# Patient Record
Sex: Male | Born: 1965 | Race: White | Hispanic: No | Marital: Married | Smoking: Current every day smoker
Health system: Southern US, Community
[De-identification: ages and names within clinical notes are randomized; demographics above are authoritative.]

## PROBLEM LIST (undated history)

## (undated) DIAGNOSIS — R51 Headache: Secondary | ICD-10-CM

## (undated) DIAGNOSIS — I2510 Atherosclerotic heart disease of native coronary artery without angina pectoris: Secondary | ICD-10-CM

## (undated) DIAGNOSIS — E785 Hyperlipidemia, unspecified: Secondary | ICD-10-CM

## (undated) DIAGNOSIS — F17200 Nicotine dependence, unspecified, uncomplicated: Secondary | ICD-10-CM

## (undated) DIAGNOSIS — K5792 Diverticulitis of intestine, part unspecified, without perforation or abscess without bleeding: Secondary | ICD-10-CM

## (undated) DIAGNOSIS — I214 Non-ST elevation (NSTEMI) myocardial infarction: Secondary | ICD-10-CM

## (undated) DIAGNOSIS — F429 Obsessive-compulsive disorder, unspecified: Secondary | ICD-10-CM

## (undated) DIAGNOSIS — N529 Male erectile dysfunction, unspecified: Secondary | ICD-10-CM

## (undated) DIAGNOSIS — J449 Chronic obstructive pulmonary disease, unspecified: Secondary | ICD-10-CM

## (undated) DIAGNOSIS — I10 Essential (primary) hypertension: Secondary | ICD-10-CM

## (undated) DIAGNOSIS — T1490XA Injury, unspecified, initial encounter: Secondary | ICD-10-CM

## (undated) DIAGNOSIS — I739 Peripheral vascular disease, unspecified: Secondary | ICD-10-CM

## (undated) DIAGNOSIS — K219 Gastro-esophageal reflux disease without esophagitis: Secondary | ICD-10-CM

## (undated) DIAGNOSIS — F419 Anxiety disorder, unspecified: Secondary | ICD-10-CM

## (undated) DIAGNOSIS — K635 Polyp of colon: Secondary | ICD-10-CM

## (undated) DIAGNOSIS — R519 Headache, unspecified: Secondary | ICD-10-CM

## (undated) DIAGNOSIS — Z9889 Other specified postprocedural states: Secondary | ICD-10-CM

## (undated) DIAGNOSIS — R42 Dizziness and giddiness: Secondary | ICD-10-CM

## (undated) DIAGNOSIS — IMO0001 Reserved for inherently not codable concepts without codable children: Secondary | ICD-10-CM

## (undated) DIAGNOSIS — M1990 Unspecified osteoarthritis, unspecified site: Secondary | ICD-10-CM

## (undated) DIAGNOSIS — K449 Diaphragmatic hernia without obstruction or gangrene: Secondary | ICD-10-CM

## (undated) HISTORY — DX: Other specified postprocedural states: Z98.890

## (undated) HISTORY — DX: Diverticulitis of intestine, part unspecified, without perforation or abscess without bleeding: K57.92

## (undated) HISTORY — DX: Polyp of colon: K63.5

## (undated) HISTORY — DX: Obsessive-compulsive disorder, unspecified: F42.9

## (undated) HISTORY — DX: Diaphragmatic hernia without obstruction or gangrene: K44.9

## (undated) HISTORY — DX: Unspecified osteoarthritis, unspecified site: M19.90

## (undated) HISTORY — PX: COLON SURGERY: SHX602

## (undated) HISTORY — DX: Dizziness and giddiness: R42

## (undated) HISTORY — DX: Peripheral vascular disease, unspecified: I73.9

## (undated) HISTORY — DX: Headache, unspecified: R51.9

## (undated) HISTORY — PX: CARDIAC CATHETERIZATION: SHX172

## (undated) HISTORY — DX: Gastro-esophageal reflux disease without esophagitis: K21.9

## (undated) HISTORY — DX: Essential (primary) hypertension: I10

## (undated) HISTORY — PX: HERNIA REPAIR: SHX51

## (undated) HISTORY — DX: Hyperlipidemia, unspecified: E78.5

## (undated) HISTORY — PX: UPPER ENDOSCOPY W/ ESOPHAGEAL MANOMETRY: SHX2604

## (undated) HISTORY — DX: Male erectile dysfunction, unspecified: N52.9

## (undated) HISTORY — DX: Headache: R51

---

## 2000-02-13 ENCOUNTER — Emergency Department (HOSPITAL_COMMUNITY): Admission: EM | Admit: 2000-02-13 | Discharge: 2000-02-13 | Payer: Self-pay | Admitting: Emergency Medicine

## 2002-08-13 ENCOUNTER — Encounter (INDEPENDENT_AMBULATORY_CARE_PROVIDER_SITE_OTHER): Payer: Self-pay | Admitting: Specialist

## 2002-08-13 ENCOUNTER — Ambulatory Visit (HOSPITAL_COMMUNITY): Admission: RE | Admit: 2002-08-13 | Discharge: 2002-08-13 | Payer: Self-pay | Admitting: *Deleted

## 2005-03-11 ENCOUNTER — Emergency Department (HOSPITAL_COMMUNITY): Admission: EM | Admit: 2005-03-11 | Discharge: 2005-03-11 | Payer: Self-pay | Admitting: Emergency Medicine

## 2007-03-02 ENCOUNTER — Emergency Department (HOSPITAL_COMMUNITY): Admission: EM | Admit: 2007-03-02 | Discharge: 2007-03-02 | Payer: Self-pay | Admitting: Emergency Medicine

## 2007-07-24 ENCOUNTER — Emergency Department (HOSPITAL_COMMUNITY): Admission: EM | Admit: 2007-07-24 | Discharge: 2007-07-25 | Payer: Self-pay | Admitting: Emergency Medicine

## 2008-02-15 ENCOUNTER — Emergency Department (HOSPITAL_COMMUNITY): Admission: EM | Admit: 2008-02-15 | Discharge: 2008-02-15 | Payer: Self-pay | Admitting: Emergency Medicine

## 2008-06-08 ENCOUNTER — Inpatient Hospital Stay (HOSPITAL_COMMUNITY): Admission: EM | Admit: 2008-06-08 | Discharge: 2008-06-10 | Payer: Self-pay | Admitting: Emergency Medicine

## 2008-06-08 ENCOUNTER — Ambulatory Visit: Payer: Self-pay | Admitting: Cardiovascular Disease

## 2008-06-08 ENCOUNTER — Encounter (INDEPENDENT_AMBULATORY_CARE_PROVIDER_SITE_OTHER): Payer: Self-pay | Admitting: *Deleted

## 2008-06-15 ENCOUNTER — Ambulatory Visit: Payer: Self-pay | Admitting: Vascular Surgery

## 2008-12-28 ENCOUNTER — Encounter: Admission: RE | Admit: 2008-12-28 | Discharge: 2008-12-28 | Payer: Self-pay | Admitting: Gastroenterology

## 2009-02-28 ENCOUNTER — Encounter (INDEPENDENT_AMBULATORY_CARE_PROVIDER_SITE_OTHER): Payer: Self-pay | Admitting: General Surgery

## 2009-02-28 ENCOUNTER — Inpatient Hospital Stay (HOSPITAL_COMMUNITY): Admission: RE | Admit: 2009-02-28 | Discharge: 2009-03-05 | Payer: Self-pay | Admitting: General Surgery

## 2009-06-14 ENCOUNTER — Emergency Department (HOSPITAL_COMMUNITY): Admission: EM | Admit: 2009-06-14 | Discharge: 2009-06-14 | Payer: Self-pay | Admitting: Family Medicine

## 2010-02-14 ENCOUNTER — Encounter: Payer: Self-pay | Admitting: Emergency Medicine

## 2010-02-14 ENCOUNTER — Ambulatory Visit: Payer: Self-pay | Admitting: Interventional Radiology

## 2010-02-14 ENCOUNTER — Inpatient Hospital Stay (HOSPITAL_COMMUNITY): Admission: EM | Admit: 2010-02-14 | Discharge: 2010-02-16 | Payer: Self-pay | Admitting: Internal Medicine

## 2010-02-28 ENCOUNTER — Ambulatory Visit: Payer: Self-pay | Admitting: Oncology

## 2010-03-14 LAB — COMPREHENSIVE METABOLIC PANEL
ALT: 16 U/L (ref 0–53)
AST: 15 U/L (ref 0–37)
Alkaline Phosphatase: 65 U/L (ref 39–117)
BUN: 14 mg/dL (ref 6–23)
Total Bilirubin: 0.4 mg/dL (ref 0.3–1.2)
Total Protein: 7.3 g/dL (ref 6.0–8.3)

## 2010-03-14 LAB — MORPHOLOGY
PLT EST: ADEQUATE
White Cell Comments: NONE SEEN

## 2010-03-14 LAB — CBC WITH DIFFERENTIAL/PLATELET
BASO%: 0.6 % (ref 0.0–2.0)
EOS%: 3.1 % (ref 0.0–7.0)
Eosinophils Absolute: 0.4 10*3/uL (ref 0.0–0.5)
HCT: 41.1 % (ref 38.4–49.9)
LYMPH%: 27 % (ref 14.0–49.0)
MCH: 33.7 pg — ABNORMAL HIGH (ref 27.2–33.4)
MCHC: 34.7 g/dL (ref 32.0–36.0)
MCV: 97.2 fL (ref 79.3–98.0)
NEUT#: 7.7 10*3/uL — ABNORMAL HIGH (ref 1.5–6.5)
NEUT%: 61.4 % (ref 39.0–75.0)
Platelets: 352 10*3/uL (ref 140–400)
lymph#: 3.4 10*3/uL — ABNORMAL HIGH (ref 0.9–3.3)

## 2010-03-14 LAB — CHCC SMEAR

## 2010-10-04 ENCOUNTER — Other Ambulatory Visit: Payer: Self-pay | Admitting: Family Medicine

## 2010-10-04 DIAGNOSIS — R1011 Right upper quadrant pain: Secondary | ICD-10-CM

## 2010-10-05 ENCOUNTER — Ambulatory Visit
Admission: RE | Admit: 2010-10-05 | Discharge: 2010-10-05 | Disposition: A | Discharge status: Home / Self Care | Payer: BC Managed Care – PPO | Source: Ambulatory Visit | Attending: Family Medicine | Admitting: Family Medicine

## 2010-10-05 DIAGNOSIS — R1011 Right upper quadrant pain: Secondary | ICD-10-CM

## 2010-10-20 LAB — CK TOTAL AND CKMB (NOT AT ARMC)
Relative Index: 1 (ref 0.0–2.5)
Total CK: 310 U/L — ABNORMAL HIGH (ref 7–232)

## 2010-10-20 LAB — TSH: TSH: 8.068 u[IU]/mL — ABNORMAL HIGH (ref 0.350–4.500)

## 2010-10-20 LAB — BASIC METABOLIC PANEL
CO2: 27 mEq/L (ref 19–32)
Chloride: 108 mEq/L (ref 96–112)
GFR calc Af Amer: >60 mL/min (ref >60)

## 2010-10-21 LAB — DIFFERENTIAL
Basophils Absolute: 0 10*3/uL (ref 0.0–0.1)
Basophils Absolute: 0.2 10*3/uL — ABNORMAL HIGH (ref 0.0–0.1)
Basophils Relative: 0 % (ref 0–1)
Basophils Relative: 1 % (ref 0–1)
Eosinophils Absolute: 0.4 10*3/uL (ref 0.0–0.7)
Eosinophils Absolute: 0.4 10*3/uL (ref 0.0–0.7)
Eosinophils Relative: 4 % (ref 0–5)
Eosinophils Relative: 3 % (ref 0–5)
Lymphs Abs: 2.8 10*3/uL (ref 0.7–4.0)
Monocytes Relative: 9 % (ref 3–12)
Neutrophils Relative %: 57 % (ref 43–77)
Neutrophils Relative %: 64 % (ref 43–77)

## 2010-10-21 LAB — COMPREHENSIVE METABOLIC PANEL
ALT: 15 U/L (ref 0–53)
AST: 33 U/L (ref 0–37)
Alkaline Phosphatase: 85 U/L (ref 39–117)
CO2: 27 mEq/L (ref 19–32)
Chloride: 100 mEq/L (ref 96–112)
GFR calc Af Amer: 30 mL/min — ABNORMAL LOW (ref >60)
GFR calc non Af Amer: 25 mL/min — ABNORMAL LOW (ref >60)
Glucose, Bld: 93 mg/dL (ref 70–99)
Sodium: 140 mEq/L (ref 135–145)
Total Bilirubin: 0.7 mg/dL (ref 0.3–1.2)

## 2010-10-21 LAB — URINALYSIS, ROUTINE W REFLEX MICROSCOPIC
Bilirubin Urine: NEGATIVE
Glucose, UA: NEGATIVE mg/dL
Hgb urine dipstick: NEGATIVE
Ketones, ur: NEGATIVE mg/dL
pH: 5.5 (ref 5.0–8.0)

## 2010-10-21 LAB — CREATININE, URINE, RANDOM: Creatinine, Urine: 160.3 mg/dL

## 2010-10-21 LAB — CBC
HCT: 38.5 % — ABNORMAL LOW (ref 39.0–52.0)
HCT: 44.8 % (ref 39.0–52.0)
Hemoglobin: 15.4 g/dL (ref 13.0–17.0)
MCH: 33.3 pg (ref 26.0–34.0)
MCH: 33.4 pg (ref 26.0–34.0)
MCV: 96.7 fL (ref 78.0–100.0)
RDW: 13.9 % (ref 11.5–15.5)
WBC: 9.3 10*3/uL (ref 4.0–10.5)
WBC: 13.5 10*3/uL — ABNORMAL HIGH (ref 4.0–10.5)

## 2010-10-21 LAB — POCT CARDIAC MARKERS

## 2010-10-21 LAB — LIPID PANEL
Cholesterol: 180 mg/dL (ref 0–200)
HDL: 24 mg/dL — ABNORMAL LOW (ref >39)
Triglycerides: 214 mg/dL — ABNORMAL HIGH (ref <150)

## 2010-10-21 LAB — BASIC METABOLIC PANEL
BUN: 24 mg/dL — ABNORMAL HIGH (ref 6–23)
Creatinine, Ser: 1.72 mg/dL — ABNORMAL HIGH (ref 0.4–1.5)
GFR calc non Af Amer: 44 mL/min — ABNORMAL LOW (ref >60)
Glucose, Bld: 110 mg/dL — ABNORMAL HIGH (ref 70–99)

## 2010-10-21 LAB — CARDIAC PANEL(CRET KIN+CKTOT+MB+TROPI)
CK, MB: 6 ng/mL — ABNORMAL HIGH (ref 0.3–4.0)
Relative Index: 1.1 (ref 0.0–2.5)
Relative Index: 1.1 (ref 0.0–2.5)
Total CK: 454 U/L — ABNORMAL HIGH (ref 7–232)
Troponin I: 0.02 ng/mL (ref 0.00–0.06)

## 2010-10-21 LAB — SODIUM, URINE, RANDOM: Sodium, Ur: 53 mEq/L

## 2010-11-11 LAB — DIFFERENTIAL
Basophils Absolute: 0 10*3/uL (ref 0.0–0.1)
Basophils Relative: 0 % (ref 0–1)
Monocytes Relative: 8 % (ref 3–12)
Neutro Abs: 9.1 10*3/uL — ABNORMAL HIGH (ref 1.7–7.7)
Neutrophils Relative %: 69 % (ref 43–77)

## 2010-11-11 LAB — CBC
HCT: 42.8 % (ref 39.0–52.0)
Hemoglobin: 14.8 g/dL (ref 13.0–17.0)
MCV: 96.1 fL (ref 78.0–100.0)
RDW: 14.6 % (ref 11.5–15.5)

## 2010-11-11 LAB — COMPREHENSIVE METABOLIC PANEL
Alkaline Phosphatase: 75 U/L (ref 39–117)
BUN: 10 mg/dL (ref 6–23)
Glucose, Bld: 120 mg/dL — ABNORMAL HIGH (ref 70–99)
Potassium: 4.3 mEq/L (ref 3.5–5.1)
Total Bilirubin: 0.7 mg/dL (ref 0.3–1.2)
Total Protein: 7.1 g/dL (ref 6.0–8.3)

## 2010-12-18 NOTE — Discharge Summary (Signed)
NAME:  Brett, Davis NO.:  [id number]   MEDICAL RECORD NO.:  [id number]          PATIENT TYPE:  INP   LOCATION:  6523                         FACILITY:  [facility]   PHYSICIAN:  Elmore Guise., M.D.DATE OF BIRTH:  05-18-1966   DATE OF ADMISSION:  06/08/2008  DATE OF DISCHARGE:  06/10/2008                               DISCHARGE SUMMARY   DISCHARGE DIAGNOSES:  1. Acute coronary syndrome with non-ST elevation myocardial      infarction.  2. Dyslipidemia.  3. Obstructive single-vessel coronary artery disease.  4. Status post non-drug-eluting stent in proximal right coronary      artery.  5. History of tobacco dependence.   HISTORY OF PRESENT ILLNESS:  Brett Davis is a very pleasant 45 year old  white male with past medical history of tobacco dependence and  borderline hypertension who presented with chest pain.  He was found to  have acute coronary syndrome and non-ST-elevation myocardial infarction.  He was admitted for treatment and evaluation.   HOSPITAL COURSE:  The patient's hospital course was uncomplicated.  He  had a peak troponin of 8.  He was taken to the cath lab, which showed a  95-99% proximal right coronary artery stenosis.  He underwent successful  stenting.  He was noted to have hematoma in and around his sheath site  while he was in the cath lab.  His postprocedure course was  uncomplicated.  He has had no further chest pain.  His hematoma has  actually improved in size.  He has got a good pulse in his right leg  with no bruit and good distal pulses too.   He will be discharged home to continue the following medications:  1. Simvastatin 40 mg daily.  2. Aspirin 325 mg daily.  3. Toprol 25 mg daily.  4. Plavix 75 mg daily.  5. Nitroglycerin 0.4 mg 1 sublingual every 5 minutes as needed for      chest pain.   He reports that he is having financial difficulties.  I did ask him to  come by the office to get samples of both Plavix as well as  a  cholesterol medication, Crestor 20 mg taking half tablet once daily.  He  will have post cath restrictions.  I discussed no heavy lifting or  strenuous activities for the next 4 days because of his hematoma.  He is  to just go  home and rest today.  He is to avoid any heavy lifting for the next 1-2  weeks.  He should have complete tobacco cessation.  I would like to see  him in the office in 1 week.  Unless he has any further problems, I plan  to see him in the office at that time.      Elmore Guise., M.D.  Electronically Signed     TWK/MEDQ  D:  06/10/2008  T:  06/10/2008  Job:  [id number]   cc:   Jethro Bastos, M.D.

## 2010-12-18 NOTE — Procedures (Signed)
VASCULAR LAB EXAM   INDICATION:  The patient had an MI last week and a stent via the right  common femoral artery.  The patient has a large hematoma over the right  groin.   HISTORY:  Diabetes:  No.  Cardiac:  Recent MI with stenting.  Hypertension:  Yes.   EXAM:  Duplex exam of right common femoral artery, common femoral vein  and surrounding soft tissue.   IMPRESSION:  No evidence of right groin pseudoaneurysm, AV fistula or  right common femoral artery dissection.   ___________________________________________  Larina Earthly, M.D.   MC/MEDQ  D:  06/15/2008  T:  06/15/2008  Job:  [id number]

## 2010-12-18 NOTE — H&P (Signed)
NAME:  Brett Davis, Brett Davis NO.:  [id number]   MEDICAL RECORD NO.:  [id number]          PATIENT TYPE:  INP   LOCATION:  2926                         FACILITY:  [facility]   PHYSICIAN:  Rosine Abe, M.D.  DATE OF BIRTH:  1965-10-25   DATE OF ADMISSION:  06/08/2008  DATE OF DISCHARGE:                              HISTORY & PHYSICAL   CHIEF COMPLAINT:  Chest pain.   HISTORY OF PRESENT ILLNESS:  Brett Davis is a 45 year old gentleman  with past medical history significant now only for hypertension and  tobacco abuse, came in with chest pain for the last 2 days.  It feels  like pressure and tightness when he had 3 episodes yesterday and few  episodes today.  Each time it lasted for about 10-15 minutes, radiates  to jaw and right arm.  They are both exertional and nonexertional in  nature.  He also has some dyspnea and sweating and nausea but no  vomiting.  He also had some acid reflux and lower abdominal pain.  No  fever, chills.   ALLERGIES:  No known drug allergies.   PAST MEDICAL HISTORY:  Significant now only for hypertension.   SOCIAL HISTORY:  He lives in [place] with his fiancee and he is an  Engineer, maintenance.  He has a smoking history of 1 pack per day for 15  years.  He does not use alcohol or drugs.   FAMILY HISTORY:  No history of early CAD.   REVIEW OF SYSTEMS:  As per HPI, All others negative   HOME MEDICATIONS:  Hydrochlorothiazide 25 mg once totally for last 1  month.   PHYSICAL EXAMINATION:  VITALS:  Temperature 98.1, pulse 90, respirations  28, blood pressure 157/87.  GENERAL:  He is not in distress.  HEENT:  Extraocular muscle movement intact.  PERRLA.  Sclera clear.  NECK:  Supple.  No bruit or JVD.  CARDIOVASCULAR:  First and second heart sound normal.  Regular rales,  murmur or gallops.  LUNGS:  Clear to auscultation bilaterally without crackles or wheeze.  ABDOMEN: Soft and nontender.  No organomegaly.  EXTREMITIES:  No edema.  Pedal  pulses good.  NEURO:  Alert and oriented x3.   Chest x-ray, stable borderline, without any acute cardiopulmonary  process.  A 12-lead EKG, T-wave inversion in lead III and biphasic T  waves in lead VF and flat T-wave in lead V6.  This is only change that  comparative to last one.   LABORATORY DATA:  WBC 15.8 with absolute count of 0.6, hemoglobin 15.3,  platelets 314.  Sodium 139, potassium 3.8, chloride of 103, bicarb 27,  BUN 12, creatinine 1.18, glucose 143, calcium 9.4.  Point of care  cardiac enzymes first set CK-MB 11.1, troponin 0.8, myoglobulin 79.3.  Second set, CK-MB 9.8, troponin 0.91, myoglobulin 163.  First set of  cardiac enzymes, CK is 366, CK-MB is 17, troponin 1.93, and relative  index 4.6.   ASSESSMENT AND PLAN:  Brett Davis is 45 year old gentleman with past  medical history of hypertension, presented with NSTEMI.  The patient has been treated already with aspirin and  nitroglycerin drip  plus heparin drip in the ER.  We will continue with aspirin, heparin,  and nitroglycerin drip.  We will also add Integrilin.  We will admit the  patient to the step-down unit.  Cycle cardiac enzyme and EKG.  Also,  start him on beta-blocker, Lopressor 25 mg p.o. q.6 as well as Zocor 40  mg p.o. daily.  He will probably need a cardiac catheterization all in  the morning.  He will be n.p.o. for this.      Jason Coop, MD  Electronically Signed      ______________________________  Rosine Abe, M.D.    YP/MEDQ  D:  06/08/2008  T:  06/08/2008  Job:  [id number]   cc:   Elmore Guise., M.D.

## 2010-12-18 NOTE — Cardiovascular Report (Signed)
NAME:  Brett Davis, Brett Davis NO.:  [id number]   MEDICAL RECORD NO.:  [id number]          PATIENT TYPE:  INP   LOCATION:  2807                         FACILITY:  [facility]   PHYSICIAN:  Elmore Guise., M.D.DATE OF BIRTH:  1966/01/04   DATE OF PROCEDURE:  06/09/2008  DATE OF DISCHARGE:                            CARDIAC CATHETERIZATION   INDICATIONS FOR PROCEDURE:  Non-ST-elevation myocardial infarction.   DESCRIPTION OF PROCEDURE:  The patient was brought to the cardiac cath  lab after appropriate informed consent.  He was prepped and draped  sterile fashion.  Approximately 10 mL of 1% lidocaine was used for local  anesthesia.  A 5-French sheath was placed in the right femoral artery  without difficulty.  Coronary angiography, LV angiography were then  performed.  There was a small hematoma noted.  A 5-French sheath was  changed out to a 6-French sheath.  Pressure was held with improvement in  his femoral hematoma.  Sheath was left in place to continue all with  elective intervention of his RCA vessel.   FINDINGS:  1. Left Main:  Normal.  2. LAD:  Mild proximal luminal irregularities with mid 40%-50%      stenosis.  3. D1:  Small vessel with mild luminal irregularities.  4. LCX:  Nondominant with mid 40%-50% stenosis after bifurcation of      the first OM vessel.  OM-1 and OM-2 have mild to moderate luminal      irregularities.  5. RCA:  Proximal 99% stenosis with thrombus and small area of      localized dissection.  There is moderate mid to distal luminal      irregularities noted.  6. LV:  EF is 45% with mild inferior hypokinesis noted.  LVEDP was 18      mmHg.   IMPRESSION:  1. Obstructive single-vessel coronary artery disease.  2. Mild to moderate disease in his left anterior descending artery and      circumflex system.  3. Mild left ventricular dysfunction with ejection fraction of 45%      with mild inferior hypokinesis.   PLAN:  1. We will continue  aggressive medical treatment as well as risk      factor modifications.  He will have elective PCI of RCA.      Interventional cardiologist is present and agrees with treatment.      At the end of the case, the patient did have small hematoma noted      at the right femoral area, which was soft and easily compressible.      He was having no significant pain and remained with good distal      pulses.      Elmore Guise., M.D.  Electronically Signed     TWK/MEDQ  D:  06/09/2008  T:  06/09/2008  Job:  [id number]

## 2010-12-18 NOTE — Cardiovascular Report (Signed)
NAME:  Brett Davis, Brett Davis NO.:  [id number]   MEDICAL RECORD NO.:  [id number]         PATIENT TYPE:  CINP   LOCATION:                               FACILITY:  [facility]   PHYSICIAN:  Corky Crafts, MDDATE OF BIRTH:  [birth year]   DATE OF PROCEDURE:  06/09/2008  DATE OF DISCHARGE:                            CARDIAC CATHETERIZATION   PROCEDURE PERFORMED:  Percutaneous coronary intervention of the right  coronary artery aspiration thrombectomy.   OPERATOR:  Corky Crafts, MD   INDICATIONS:  Non-ST-segment elevation MI.   PROCEDURE NOTE:  The diagnostic catheterization was performed by Dr.  Reyes Ivan, and this showed a 90% thrombotic proximal right coronary artery  lesion.  The PCI was the plan of care.  A JR4 guiding catheter was  placed into the ostium of the right coronary artery.  A Prowater wire  was placed down the right coronary artery.  Fetch catheter was used to  try and aspirate thrombus.  Two passes were made but no visible thrombus  was aspirated.  A 2.5 x 12 Apex balloon was then placed across the  lesion and inflated to 8 atmospheres for 23 seconds.  A 3.5 x 18 mm  Vision stent was then placed across the lesion to cover the area that  appeared to have thrombotic material and deployed at 16 atmospheres for  53 seconds.  The stent was then postdilated with a 4.0 x 12 Demarest Voyager  balloon, inflated to 16 atmospheres for 30 seconds.  There was an  excellent angiographic result.  There was no residual stenosis.  There  was TIMI 3 flow.   IMPRESSION:  Successful percutaneous coronary intervention of the right  coronary artery with 3.5 x 18 mm bare-metal stent, postdilated to  greater than 4 mm in diameter.   RECOMMENDATIONS:  Continue aspirin and Plavix for at least 1 month but  likely longer.  He will be watched overnight.  We will continue  Integrilin postprocedure depending on how his groin looks.  If there is  any sign of bleeding, we will stop the  Integrilin.      Corky Crafts, MD  Electronically Signed     JSV/MEDQ  D:  06/09/2008  T:  06/09/2008  Job:  [id number]

## 2010-12-18 NOTE — Discharge Summary (Signed)
NAME:  Brett Davis, Brett Davis            ACCOUNT NO.:  [id number]   MEDICAL RECORD NO.:  [id number]          PATIENT TYPE:  INP   LOCATION:  5156                         FACILITY:  [facility]   PHYSICIAN:  Cherylynn Ridges, M.D.    DATE OF BIRTH:  1966/07/06   DATE OF ADMISSION:  02/28/2009  DATE OF DISCHARGE:  03/05/2009                               DISCHARGE SUMMARY   DISCHARGE DIAGNOSIS:  Sigmoid diverticulitis.   PRINCIPAL PROCEDURE:  Sigmoid colectomy with primary anastomosis.   SURGEON:  Cherylynn Ridges, MD   DISCHARGE MEDICATION:  Vicodin 1-2 tablets every 4 hours p.r.n. for  pain.   He is to follow up to see Dr. Lindie Spruce in 1 week.   RESTRICTIONS:  No lifting greater than 25 ounces for 6 weeks.  He can  drive in 1 week.  He wound care is to shower and pat it dry and cover  with 4 x 4 gauze for some serous drainage.   He is return to see me again in 1 week.   BRIEF SUMMARY AND HOSPITAL COURSE:  The patient is a 45 year old with  recurrent diverticulitis was coming in for an elective sigmoid  colectomy.  The patient underwent a procedure on the February 28, 2009, for  an uneventful sigmoid colectomy with a primary anastomosis.  Postoperatively, did well and started having bowel movements and gas on  postop day #2 and was subsequently advanced to a soft diet by postop day  #4.   It was noted on his wound today that he had some serous drainage.  No  foul smelling or purulent drainage.  His wound looked clean and dry with  no evidence of infection.  Because of this, we are having his wound pain  with Betadine and cover with gauze and he is to maintain a cover until  he comes back to see me for staple removal.   The patient does have a fluid in lower portion of his abdomen because of  his pannus, which makes it possible in this area of staple line will get  inflamed.  We will watch it as an outpatient.  Otherwise, he does not  have any evidence of wound infection.  He is to be discharged  home  today.  Follow up to see me in my office in 1 week.      Cherylynn Ridges, M.D.  Electronically Signed     JOW/MEDQ  D:  03/05/2009  T:  03/05/2009  Job:  [id number]

## 2010-12-18 NOTE — Op Note (Signed)
NAME:  Brett Davis, Brett Davis            ACCOUNT NO.:  [id number]   MEDICAL RECORD NO.:  [id number]          PATIENT TYPE:  INP   LOCATION:  5156                         FACILITY:  [facility]   PHYSICIAN:  Cherylynn Ridges, M.D.    DATE OF BIRTH:  [birth year]   DATE OF PROCEDURE:  02/28/2009  DATE OF DISCHARGE:                               OPERATIVE REPORT   PREOPERATIVE DIAGNOSIS:  Recurrent sigmoid diverticulitis.   POSTOPERATIVE DIAGNOSIS:  Recurrent sigmoid diverticulitis.   PROCEDURE:  Sigmoid colectomy.   SURGEON:  Cherylynn Ridges, MD   ASSISTANT:  Angelia Mould. Derrell Lolling, MD   ANESTHESIA:  General endotracheal.   ESTIMATED BLOOD LOSS:  Less than 100 mL.   COMPLICATIONS:  None.   CONDITION:  Stable.   FINDINGS:  The patient had acute mid sigmoid diverticulitis with  adhesions to the dome of the gallbladder, but no fistulization noted who  now comes in for sigmoid colectomy.   FINDINGS AT THE TIME OF SURGERY:  The patient found a large mid sigmoid  mass.   INDICATIONS FOR OPERATION:  The patient is a 45 year old with recurrent  bouts of diverticulitis who is coming in now for an elective colectomy.   OPERATION:  The patient was taken to the operating room, placed on table  in supine position.  After an adequate general endotracheal anesthetic  was administered, he was prepped and draped in usual sterile manner  exposing the midline of the abdomen.   Midline incision was made from just left the umbilicus down through the  midline down to the pubic crest.  It was taken down to and through the  midline fascia using electrocautery.  We entered to the peritoneal  cavity carefully with Metzenbaum scissors and opened incision fully  using cautery.   The patient was placed in Trendelenburg and we are able to palpate the  chronically inflamed area of sigmoid colon, which was attached distally  towards the bladder to the dome of the bladder.  There was no fistula  noted.  We are able to take  down these adhesions to the dome of bladder  and then subsequently mobilize the distal sigmoid colon and come across  with a GIA-75 stapler, leaving a long the distal segment.  Proximally,  we came about 6-7 cm proximal to the inflammatory mass and came across  that with a GIA-75 stapler.  Then, mesentery was taken with a LigaSure  device.  We removed the segment marked proximally and with a suture.   We freshened the ends of the distal descending colon and the distal  sigmoid colon with cautery and then subsequently did a 2-layered  anastomosis using a Lembert stitches of 3-0 silk and then inner full-  thickness canal stitches with 3-0 Vicryl.  Once the anastomosis was  completed, there was minimal tension.  We irrigated with saline, changed  the surgeon's glove prior to doing so.  There was no spillage of enteric  contents into the wound.  We palpated the entire abdomen, found no other  abnormalities including palpating the spleen, the liver, and stomach.  Old G-tube was removed.  Once we completed the procedure, we closed.   We closed the fascia using a running looped PDS suture, irrigated subcu  and then closed skin using stainless steel staples.  All needle, sponge  counts, and instrument counts were correct.      Cherylynn Ridges, M.D.  Electronically Signed     JOW/MEDQ  D:  02/28/2009  T:  03/01/2009  Job:  [id number]   cc:   Evelena Peat, M.D.  Jordan Hawks Elnoria Howard, MD

## 2011-01-28 ENCOUNTER — Ambulatory Visit (HOSPITAL_COMMUNITY)
Admission: RE | Admit: 2011-01-28 | Discharge: 2011-01-28 | Disposition: A | Discharge status: Home / Self Care | Payer: BC Managed Care – PPO | Source: Ambulatory Visit | Attending: Gastroenterology | Admitting: Gastroenterology

## 2011-01-28 DIAGNOSIS — R1310 Dysphagia, unspecified: Secondary | ICD-10-CM | POA: Insufficient documentation

## 2011-01-28 DIAGNOSIS — R0789 Other chest pain: Secondary | ICD-10-CM | POA: Insufficient documentation

## 2011-01-28 DIAGNOSIS — R12 Heartburn: Secondary | ICD-10-CM | POA: Insufficient documentation

## 2011-01-28 DIAGNOSIS — R05 Cough: Secondary | ICD-10-CM | POA: Insufficient documentation

## 2011-01-28 DIAGNOSIS — R112 Nausea with vomiting, unspecified: Secondary | ICD-10-CM | POA: Insufficient documentation

## 2011-01-28 DIAGNOSIS — R059 Cough, unspecified: Secondary | ICD-10-CM | POA: Insufficient documentation

## 2011-01-28 DIAGNOSIS — K219 Gastro-esophageal reflux disease without esophagitis: Secondary | ICD-10-CM | POA: Insufficient documentation

## 2011-01-28 DIAGNOSIS — Z79899 Other long term (current) drug therapy: Secondary | ICD-10-CM | POA: Insufficient documentation

## 2011-02-26 ENCOUNTER — Encounter (INDEPENDENT_AMBULATORY_CARE_PROVIDER_SITE_OTHER): Payer: BC Managed Care – PPO | Admitting: General Surgery

## 2011-03-01 ENCOUNTER — Encounter (INDEPENDENT_AMBULATORY_CARE_PROVIDER_SITE_OTHER): Payer: Self-pay | Admitting: General Surgery

## 2011-03-04 ENCOUNTER — Ambulatory Visit (INDEPENDENT_AMBULATORY_CARE_PROVIDER_SITE_OTHER): Payer: BC Managed Care – PPO | Admitting: General Surgery

## 2011-03-04 ENCOUNTER — Encounter (INDEPENDENT_AMBULATORY_CARE_PROVIDER_SITE_OTHER): Payer: Self-pay | Admitting: General Surgery

## 2011-03-04 VITALS — BP 142/86 | HR 64 | Temp 97.2°F | Ht 70.0 in | Wt 230.8 lb

## 2011-03-04 DIAGNOSIS — K449 Diaphragmatic hernia without obstruction or gangrene: Secondary | ICD-10-CM

## 2011-03-04 NOTE — Progress Notes (Signed)
Subjective:     Patient ID: Brett Davis, male   DOB: 1966/05/17, 45 y.o.   MRN: [id number]  HPI This is a 45 year old Caucasian male, referred to me by Dr. Jeani Hawking for consideration of hiatal hernia repair and fundoplication. His primary care physician is Dr. Warrick Parisian at [facility]. He is also followed by a cardiologist in [facility].  The patient gives a several year history of reflux symptoms. This is manifested by water brash, substernal burning discomfort, daily vomiting. He has problems intermittently with cough and bronchitis. He states that his primary care physician told him that he had COPD. He is a cigarette smoker.  He has been followed by Dr. Jeani Hawking for about 3 years who has tried a variety of maneuvers but the patient remains symptomatic. A CT scan in 2006 showed a large hiatal hernia. He has had a manometry on January 28, 2011 shows the lower esophageal sphincter pressure of 25 mm of mercury which is normal. 82% of contractions were peristaltic. He's also had a pH study which is on the chart and confirms acid reflux. He had an upper endoscopy by Dr. Elnoria Howard which shows a 5 cm hiatal hernia, esophagitis and duodenitis, refluxate in the esophagus. Biopsy of the stomach showed reactive gastropathy. An ultrasound of his gallbladder showed no stones the gallbladder was contracted.  Past history is significant for coronary artery disease with catheterization and stent by Dr. Ralph Dowdy. in the past. Also he's had a sigmoid colectomy by Dr. Lindie Spruce for diverticulosis in 2010. He continues to smoke.  He states that his reflux symptoms are now interfering with his life and work and he really wants to have something done.  Past Medical History  Diagnosis Date  . Hypertension   . GERD (gastroesophageal reflux disease)   . Hiatal hernia   . Diverticulitis   . Hyperlipidemia   . History of endoscopy   . Abdominal pain   . Hiatal hernia   . Arthritis   .  Generalized headaches   . Heart attack   . Dizziness - light-headed     Current Outpatient Prescriptions  Medication Sig Dispense Refill  . ALPRAZolam (XANAX) 0.5 MG tablet Take 0.5 mg by mouth at bedtime as needed.        Marland Kitchen aspirin 325 MG tablet Take 325 mg by mouth daily.        Marland Kitchen desvenlafaxine (PRISTIQ) 50 MG 24 hr tablet Take 50 mg by mouth daily.        Marland Kitchen dexlansoprazole (DEXILANT) 60 MG capsule Take 60 mg by mouth daily.        Marland Kitchen ezetimibe (ZETIA) 10 MG tablet Take 10 mg by mouth daily.        . nebivolol (BYSTOLIC) 5 MG tablet Take 5 mg by mouth daily.        . Temazepam & Diet Manage Prod (TEMAZEPAM-NUTRITIONAL SUPP) 15 MG MISC Take by mouth daily.        . hydrochlorothiazide 25 MG tablet Take 25 mg by mouth daily.        . simvastatin (ZOCOR) 10 MG tablet Take 10 mg by mouth at bedtime.        . sucralfate (CARAFATE) 1 G tablet Take 1 g by mouth 4 (four) times daily.          No Known Allergies  History reviewed. No pertinent family history.  History  Substance Use Topics  . Smoking status: Current Everyday Smoker -- 0.5  packs/day  . Smokeless tobacco: Not on file  . Alcohol Use: No      Review of Systems  Constitutional: Negative.        30 pound weight gain last 2 years.  HENT: Negative.   Eyes: Negative.   Respiratory: Positive for apnea, cough and wheezing. Negative for choking, chest tightness, shortness of breath and stridor.   Cardiovascular: Negative.   Gastrointestinal:       Reflux symptoms, vomiting, water brash.  Genitourinary: Negative.   Musculoskeletal: Negative.   Skin: Negative.   Neurological: Negative.   Hematological: Negative.   Psychiatric/Behavioral: Negative.        Objective:   Physical Exam  Constitutional: He is oriented to person, place, and time. He appears well-developed and well-nourished. No distress.  HENT:  Head: Normocephalic and atraumatic.  Mouth/Throat: No oropharyngeal exudate.  Eyes: Conjunctivae and EOM are  normal. No scleral icterus.  Neck: Normal range of motion. No JVD present. No tracheal deviation present. No thyromegaly present.  Cardiovascular: Normal rate, regular rhythm, normal heart sounds and intact distal pulses.   No murmur heard. Pulmonary/Chest: Effort normal. He has no wheezes. He has no rales.  Abdominal: Soft. Bowel sounds are normal. He exhibits no distension and no mass. There is no tenderness. There is no rebound and no guarding.    Musculoskeletal: Normal range of motion. He exhibits no edema and no tenderness.  Lymphadenopathy:    He has no cervical adenopathy.  Neurological: He is alert and oriented to person, place, and time. He exhibits normal muscle tone.  Skin: Skin is warm and dry. No rash noted. No erythema. No pallor.  Psychiatric: He has a normal mood and affect. His behavior is normal. Judgment and thought content normal.       Assessment:     Gastroesophageal reflux disease in association with sliding hiatal hernia. He is significantly symptomatic despite maximal medical therapy, and I believe he would benefit from surgical intervention.  Coronary artery disease, status post cardiac catheter and stent placement to the right coronary.   Tobacco abuse.  Hypertension.  Status post right renal hernia  . Diverticulosis, status post sigmoid colectomy.  Possible early COPD.  Anxiety and depression Plan:     The patient was advised to proceed with preoperative evaluation planning for reduction and repair of his hiatal hernia and a Nissen fundoplication. He is very much in favor of this.  The patient will be scheduled for an upper GI with barium.  The patient is referred back to his cardiologist in [facility] for formal cardiac clearance.  The patient is advised to stop smoking.  The patient is advised to lose weight.  The patient returned to see me in approximately one month for final discussion and planning.  I discussed the indications and  details of surgery with him and his wife. Risks andc omplications have been outlined, including but not limited to bleeding, infection, conversion to open laparotomy, injury to the spleen necessitating splenectomy, perforation of the stomach or esophagus with sepsis, recurrence of the hiatal hernia, stricture requiring dilatation, incomplete resolution of reflux, wound problems, cardiac and pulmonary problems. He seems to understand all these issues well. At this time all his questions were answered. He would like to proceed with preoperative evaluation planning in hopes of having the surgery.

## 2011-03-04 NOTE — Patient Instructions (Signed)
I believe that you would benefit from reduction and repair of your hiatal hernia and a fundoplication. I will need a formal written cardiac clearance from your cardiologist in [facility]. We will schedule you for an upper GI so I can have an anatomical roadmap prior to planning your surgery. You will return to see me in about one month after all of this is done. Please try to lose weight and stop smoking.

## 2011-03-08 ENCOUNTER — Other Ambulatory Visit (INDEPENDENT_AMBULATORY_CARE_PROVIDER_SITE_OTHER): Payer: Self-pay | Admitting: General Surgery

## 2011-03-08 ENCOUNTER — Ambulatory Visit
Admission: RE | Admit: 2011-03-08 | Discharge: 2011-03-08 | Disposition: A | Discharge status: Home / Self Care | Payer: BC Managed Care – PPO | Source: Ambulatory Visit | Attending: General Surgery | Admitting: General Surgery

## 2011-03-08 DIAGNOSIS — K449 Diaphragmatic hernia without obstruction or gangrene: Secondary | ICD-10-CM

## 2011-03-25 ENCOUNTER — Ambulatory Visit (INDEPENDENT_AMBULATORY_CARE_PROVIDER_SITE_OTHER): Payer: BC Managed Care – PPO | Admitting: General Surgery

## 2011-03-25 ENCOUNTER — Encounter (INDEPENDENT_AMBULATORY_CARE_PROVIDER_SITE_OTHER): Payer: Self-pay | Admitting: General Surgery

## 2011-03-25 VITALS — BP 152/100 | HR 80 | Temp 99.2°F

## 2011-03-25 DIAGNOSIS — K449 Diaphragmatic hernia without obstruction or gangrene: Secondary | ICD-10-CM

## 2011-03-25 NOTE — Progress Notes (Signed)
Subjective:     Patient ID: Brett Davis, male   DOB: 01/22/1966, 45 y.o.   MRN: [id number]  HPI Patient returns for discussion of anti-reflux surgery.  Significant family issue is that his 61-year-old adopted brother died of a drowning accident last week and the funeral is this weet. This has been very sad for the entire family.  He had his upper GI which shows a small to moderate sized sliding hiatal hernia.  He was seen by his cardiologist, Dr. Timoteo Gaul at [facility] cardiology. A very thorough evaluation was done. A nuclear cardiac imaging stress test was performed which shows an ejection fraction of 53%, a small fixed inferior defect consistent with previous MI,  little or no change compared to March 22, 2010. No evidence of stress-induced hypoperfusion was identified. We anticipate that he will be cleared for surgery from a cardiac standpoint.  Past discussed the indications and details of anti-reflux surgery with him again. He understands the techniques and risks. He was to go ahead with this in a few weeks. We'll plan to schedule his convenience. Review of Systems     Objective:   Physical Exam The patient is in no distress, other than having a sad affect..  Abdomen soft and nontender. Liver and spleen not enlarged. No palpable mass. Well-healed lower midline scar starting at the umbilicus and going down to the suprapubic area.   Assessment:     Gastroesophageal reflux disease and associated sliding hiatal hernia. Continued symptoms despite maximal medical therapy. He will likely benefit from surgical intervention.  Coronary artery disease, status post stent placement to right coronary, stable. C. cardiac evaluation  Tobacco abuse  Obesity  Hypertension  Diverticulitis status post sigmoid colectomy  Anxiety and depression.   Plan:     Scheduled for reduction and repair of his sliding hiatal hernia with Nissen fundoplication.  Discontinue aspirin 3-4  days preop.  Complete cardiac clearance.  Discontinue smoking was strongly urged.  We have discussed the indications, details, techniques, and risks of surgery. He is comfortable all this and is ready to go ahead and scheduling.

## 2011-03-25 NOTE — Patient Instructions (Signed)
Your upper GI shows a small to moderate sized sliding hiatal hernia, and we should be able to repair that. I have reviewed the office note from your cardiologist as well as the stress test. I would anticipate that you will be cleared from a cardiac standpoint for surgery. I strongly urged you to stop smoking. I strongly urge you to lose weight. We will schedule you for a Nissen fundoplication in the near future.

## 2011-03-26 ENCOUNTER — Telehealth (INDEPENDENT_AMBULATORY_CARE_PROVIDER_SITE_OTHER): Payer: Self-pay

## 2011-03-26 NOTE — Telephone Encounter (Signed)
Clearance here and orders to surgery schedulers.

## 2011-05-07 LAB — CBC
HCT: 38.5 — ABNORMAL LOW
HCT: 40.5
HCT: 43.4
Hemoglobin: 13.2
Hemoglobin: 13.7
MCHC: 33.9
MCHC: 34.2
MCHC: 35.3
MCV: 96.4
Platelets: 283
Platelets: 314
RBC: 4.13 — ABNORMAL LOW
RDW: 13
RDW: 13.1
RDW: 13.2
WBC: 14.7 — ABNORMAL HIGH

## 2011-05-07 LAB — BASIC METABOLIC PANEL
BUN: 11
BUN: 12
BUN: 8
CO2: 27
CO2: 29
CO2: 30
Chloride: 102
Chloride: 103
Glucose, Bld: 143 — ABNORMAL HIGH
Glucose, Bld: 156 — ABNORMAL HIGH
Potassium: 3.8
Potassium: 3.9
Potassium: 4.5
Sodium: 139

## 2011-05-07 LAB — D-DIMER, QUANTITATIVE: D-Dimer, Quant: 0.26

## 2011-05-07 LAB — COMPREHENSIVE METABOLIC PANEL
ALT: 26
Alkaline Phosphatase: 52
BUN: 11
CO2: 30
GFR calc non Af Amer: >60
Glucose, Bld: 143 — ABNORMAL HIGH
Potassium: 3.9
Sodium: 140
Total Bilirubin: 0.5
Total Protein: 7.2

## 2011-05-07 LAB — CARDIAC PANEL(CRET KIN+CKTOT+MB+TROPI)
Relative Index: 6.2 — ABNORMAL HIGH
Relative Index: 7 — ABNORMAL HIGH
Troponin I: 8.29
Troponin I: 8.92

## 2011-05-07 LAB — HEPARIN LEVEL (UNFRACTIONATED)
Heparin Unfractionated: 0.2 — ABNORMAL LOW
Heparin Unfractionated: 0.21 — ABNORMAL LOW

## 2011-05-07 LAB — MAGNESIUM: Magnesium: 2.2

## 2011-05-07 LAB — LIPID PANEL
Cholesterol: 170
HDL: 28 — ABNORMAL LOW
LDL Cholesterol: 126 — ABNORMAL HIGH
Triglycerides: 82

## 2011-05-07 LAB — POCT CARDIAC MARKERS
CKMB, poc: 11.1
CKMB, poc: 9.8
Myoglobin, poc: 79.3

## 2011-05-07 LAB — APTT: aPTT: 49 — ABNORMAL HIGH

## 2011-05-07 LAB — DIFFERENTIAL
Eosinophils Absolute: 0.4
Eosinophils Relative: 2
Lymphs Abs: 3.7

## 2011-05-07 LAB — PROTIME-INR
INR: 0.9
Prothrombin Time: 12.3

## 2011-05-07 LAB — CK TOTAL AND CKMB (NOT AT ARMC)
CK, MB: 17 — ABNORMAL HIGH
Relative Index: 4.6 — ABNORMAL HIGH
Total CK: 366 — ABNORMAL HIGH

## 2011-05-10 LAB — POCT CARDIAC MARKERS
CKMB, poc: 1.1
Operator id: 4531
Troponin i, poc: <0.05

## 2011-05-10 LAB — COMPREHENSIVE METABOLIC PANEL
Alkaline Phosphatase: 51
BUN: 5 — ABNORMAL LOW
CO2: 27
Chloride: 110
Creatinine, Ser: 1.07
GFR calc non Af Amer: >60
Glucose, Bld: 103 — ABNORMAL HIGH
Potassium: 3.8
Total Bilirubin: 0.8

## 2011-05-10 LAB — DIFFERENTIAL
Basophils Absolute: 0.1
Basophils Relative: 1
Lymphocytes Relative: 28
Monocytes Absolute: 1
Neutro Abs: 8.4 — ABNORMAL HIGH

## 2011-05-10 LAB — CBC
HCT: 43.8
MCV: 94.1

## 2011-05-15 ENCOUNTER — Other Ambulatory Visit (INDEPENDENT_AMBULATORY_CARE_PROVIDER_SITE_OTHER): Payer: Self-pay | Admitting: General Surgery

## 2011-05-15 ENCOUNTER — Encounter (HOSPITAL_COMMUNITY): Payer: BC Managed Care – PPO

## 2011-05-15 ENCOUNTER — Ambulatory Visit (HOSPITAL_COMMUNITY)
Admission: RE | Admit: 2011-05-15 | Discharge: 2011-05-15 | Disposition: A | Discharge status: Home / Self Care | Payer: BC Managed Care – PPO | Source: Ambulatory Visit | Attending: General Surgery | Admitting: General Surgery

## 2011-05-15 DIAGNOSIS — Z01818 Encounter for other preprocedural examination: Secondary | ICD-10-CM | POA: Insufficient documentation

## 2011-05-15 DIAGNOSIS — K449 Diaphragmatic hernia without obstruction or gangrene: Secondary | ICD-10-CM | POA: Insufficient documentation

## 2011-05-15 DIAGNOSIS — Z01812 Encounter for preprocedural laboratory examination: Secondary | ICD-10-CM | POA: Insufficient documentation

## 2011-05-15 LAB — COMPREHENSIVE METABOLIC PANEL
ALT: 17 U/L (ref 0–53)
AST: 18 U/L (ref 0–37)
CO2: 28 mEq/L (ref 19–32)
Calcium: 9.5 mg/dL (ref 8.4–10.5)
GFR calc non Af Amer: >90 mL/min (ref >90)
Sodium: 139 mEq/L (ref 135–145)

## 2011-05-15 LAB — URINALYSIS, ROUTINE W REFLEX MICROSCOPIC
Bilirubin Urine: NEGATIVE
Hgb urine dipstick: NEGATIVE
Ketones, ur: NEGATIVE mg/dL
Nitrite: NEGATIVE
Protein, ur: NEGATIVE mg/dL
Urobilinogen, UA: 0.2 mg/dL (ref 0.0–1.0)

## 2011-05-15 LAB — CBC
HCT: 41 % (ref 39.0–52.0)
MCHC: 33.9 g/dL (ref 30.0–36.0)
MCV: 94.7 fL (ref 78.0–100.0)
Platelets: 300 10*3/uL (ref 150–400)
RDW: 13.6 % (ref 11.5–15.5)
WBC: 11.6 10*3/uL — ABNORMAL HIGH (ref 4.0–10.5)

## 2011-05-15 LAB — DIFFERENTIAL
Eosinophils Absolute: 0.4 10*3/uL (ref 0.0–0.7)
Eosinophils Relative: 3 % (ref 0–5)
Lymphocytes Relative: 35 % (ref 12–46)
Lymphs Abs: 4 10*3/uL (ref 0.7–4.0)
Monocytes Absolute: 0.9 10*3/uL (ref 0.1–1.0)

## 2011-05-17 ENCOUNTER — Ambulatory Visit (HOSPITAL_COMMUNITY)
Admission: RE | Admit: 2011-05-17 | Discharge: 2011-05-19 | Disposition: A | Discharge status: Home / Self Care | Payer: BC Managed Care – PPO | Source: Ambulatory Visit | Attending: General Surgery | Admitting: General Surgery

## 2011-05-17 DIAGNOSIS — K449 Diaphragmatic hernia without obstruction or gangrene: Secondary | ICD-10-CM | POA: Insufficient documentation

## 2011-05-17 DIAGNOSIS — Z7982 Long term (current) use of aspirin: Secondary | ICD-10-CM | POA: Insufficient documentation

## 2011-05-17 DIAGNOSIS — I2510 Atherosclerotic heart disease of native coronary artery without angina pectoris: Secondary | ICD-10-CM | POA: Insufficient documentation

## 2011-05-17 DIAGNOSIS — F17200 Nicotine dependence, unspecified, uncomplicated: Secondary | ICD-10-CM | POA: Insufficient documentation

## 2011-05-17 DIAGNOSIS — E785 Hyperlipidemia, unspecified: Secondary | ICD-10-CM | POA: Insufficient documentation

## 2011-05-17 DIAGNOSIS — K2100 Gastro-esophageal reflux disease with esophagitis, without bleeding: Secondary | ICD-10-CM

## 2011-05-17 DIAGNOSIS — Z79899 Other long term (current) drug therapy: Secondary | ICD-10-CM | POA: Insufficient documentation

## 2011-05-17 DIAGNOSIS — I252 Old myocardial infarction: Secondary | ICD-10-CM | POA: Insufficient documentation

## 2011-05-17 DIAGNOSIS — K219 Gastro-esophageal reflux disease without esophagitis: Secondary | ICD-10-CM | POA: Insufficient documentation

## 2011-05-17 DIAGNOSIS — I10 Essential (primary) hypertension: Secondary | ICD-10-CM | POA: Insufficient documentation

## 2011-05-18 ENCOUNTER — Ambulatory Visit (HOSPITAL_COMMUNITY): Payer: BC Managed Care – PPO

## 2011-05-18 MED ORDER — IOHEXOL 300 MG/ML  SOLN
50.0000 mL | Freq: Once | INTRAMUSCULAR | Status: AC | PRN
  Administered 2011-05-18: 50 mL via ORAL

## 2011-05-18 NOTE — Op Note (Signed)
NAMEMarland Kitchen  Brett Davis, Brett Davis NO.:  [id number]  MEDICAL RECORD NO.:  [id number]  LOCATION:  1523                         FACILITY:  [facility]  PHYSICIAN:  Angelia Mould. Derrell Lolling, M.D.DATE OF BIRTH:  03/09/1966  DATE OF PROCEDURE:  05/17/2011 DATE OF DISCHARGE:                              OPERATIVE REPORT   PREOPERATIVE DIAGNOSES:  Hiatal hernia and gastroesophageal reflux disease.  POSTOPERATIVE DIAGNOSES:  Hiatal hernia and gastroesophageal reflux disease.  OPERATION PERFORMED:  Reduction and repair of hiatal hernia and laparoscopic Nissen fundoplication.  SURGEON:  Angelia Mould. Derrell Lolling, M.D.  FIRST ASSISTANT:  Adolph Pollack, M.D.  OPERATIVE INDICATIONS:  This is a 45 year old Caucasian male with coronary artery disease, hypertension, asthma, tobacco abuse and obesity.  He has been followed by Dr. Jeani Hawking for 3 years for reflux esophagitis, but remains very symptomatic.  CT scan shows a hiatal hernia.  Upper GI shows a small to medium sized type 1 sliding hiatal hernia.  He has had a pH study which confirms acid reflux.  He has had a manometry which shows good peristalsis.  Upper endoscopy shows a 5 cm hiatal hernia, esophagitis and duodenitis.  He is felt to be at increased risk for surgery, but because of his ongoing symptoms, he was offered the option of anti-reflux surgery.  He is aware that he is an increase risk patient.  OPERATIVE FINDINGS:  The patient had a type 1 sliding hiatal hernia.  He had a huge amount of intraabdominal fat, both in the omentum, and around the esophageal area.  We had to debride the esophageal fat pad.  This made the dissection very slow and tedious, but it was uneventful, and ultimately, we were able to identify the anatomy and have good visualization  The liver was soft, but it was enlarged.  No other abnormalities were noted.  The spleen looked to be normal in size.  OPERATIVE TECHNIQUE:  Following the induction of general  endotracheal anesthesia, a Foley catheter was placed.  The abdomen was prepped and draped in a sterile fashion.  Intravenous antibiotics were given.  The patient was identified as correct patient and correct procedure.  A 5 mm optical port was placed in the right subcostal area.  This entry was uneventful.  Pneumoperitoneum was created.  Video CAM was inserted.  We could see adhesions in the lower abdomen from his previous sigmoid colectomy.  We placed an 11 mm trocar in the right upper quadrant in addition.  We placed a 11 mm trocar in the left mid abdomen just to the left of the midline.  Two 5 mm trocars were placed in the left subcostal area and a 5 mm trocar was placed in the left subxiphoid area.  We removed the 5 mm trocar in the subxiphoid area and put a Nathanson retractor in to retract the liver.  The Nathanson retractor caused a small injury to the very left lateral triangular ligament of the liver which required some cautery to control this.  We probably lost about 15 mL of blood from this and it was completely hemostatic at the end of the case.  We positioned the [facility] retractor again and secured it with a self- retaining  retractor.  We identified the gastric body and then the fundus and then slowly dissected out the esophageal hiatus.  We started on the right side, identifying the right crus.  We divided the gastrohepatic ligament with the Harmonic Scalpel.  We dissected the right crus away from the surrounding tissues with the Harmonic Scalpel.  We very carefully came across the apex of the crura with the Harmonic Scalpel. We then dissected the upper part of the left crus away and then we could see the esophagus fairly well.  We took down the short gastrics with the Harmonic Scalpel.  This was fairly tedious because of the fatty tissue. We ultimately had mobilized the upper greater curvature and the fundus completely away from the left crus and the spleen.  We slowly had  to dissect around behind the esophagus and we were able to pass a blunt instrument and Tanja Port behind that and bring a Penrose drain around it, then we could lift everything up.  We further dissected the esophagus away from the surrounding tissues.  We felt that we visualized the posterior vagus nerve on 2 or 3 occasions and we felt that we had preserved that.  Ultimately, we were able to see the left crus from the right side.  We closed the diaphragm with two pledgeted sutures of 0 Surgidac. Anesthesia Department then put a 50-French lighted dilator down through the esophagus into the stomach under direct vision.  That went without any problem.  After this was placed, the diaphragmatic closure seemed tight enough and no further sutures were placed.  We pulled the dilator back some.  We then brought a suitable piece of fundus and passed it behind the esophagus from the left to the right side.  We then picked up a suitable piece of fundus on the left side and made sure that they were contiguous and we could do a shoe-shine maneuver.  We then performed the fundoplication as a Nissen fundoplication with 3 interrupted sutures of 0 Surgidac.  Each of these sutures took a bite of the fundus on the left side, a bite of the anterior wall of the esophagus just to the right of the midline, and a bite of the fundus on the right side.  We very carefully tied this with the Ti-KNOT device under no tension.  We took a photograph of the repair after repairing the diaphragm and after the fundoplication.  There was no bleeding anywhere.  We irrigated the field.  We took a Surveyor, mining out and inspected the liver. There was no bleeding from any area of the liver.  The trocars were removed.  There was no bleeding from the trocar sites.  Pneumoperitoneum was released.  The skin incisions were closed with skin staples.  Clean bandages were placed.  The patient tolerated the procedure well and  was taken to recovery room in stable condition.  Estimated blood loss was about 40 mL.  Complications none.  Sponge, needle, and instrument counts were correct.     Angelia Mould. Derrell Lolling, M.D.     HMI/MEDQ  D:  05/17/2011  T:  05/17/2011  Job:  [id number]  cc:   Jordan Hawks. Elnoria Howard, MD Fax: [phone]  Warrick Parisian, MD Fax: [phone]  Dr. Montez Morita  Electronically Signed by Claud Kelp M.D. on 05/18/2011 01:14:05 PM

## 2011-05-23 ENCOUNTER — Telehealth (INDEPENDENT_AMBULATORY_CARE_PROVIDER_SITE_OTHER): Payer: Self-pay

## 2011-05-23 NOTE — Telephone Encounter (Signed)
Patient called regarding his df that he has been waiting to get signed and faxed.  He had left messages but did not get a call back.  He asked if we could have another md sign the form while Dr Derrell Lolling is on vacation.  I was able to get that signed by Dr Carolynne Edouard and faxed it to [phone] attention Tom.    The patient had questions regarding his bowel movements and what he could take.  He is on a liquid diet now.  I advised to make sure to drink plenty of liquids and take milk of magnesia or Miralax which he could mix with juice.  He has been walking around.  He will call if it worsens.

## 2011-05-24 ENCOUNTER — Telehealth (INDEPENDENT_AMBULATORY_CARE_PROVIDER_SITE_OTHER): Payer: Self-pay

## 2011-05-24 NOTE — Telephone Encounter (Signed)
Pt home doing well. Po appt made. Pt to call with questions or concerns.

## 2011-06-07 ENCOUNTER — Telehealth (INDEPENDENT_AMBULATORY_CARE_PROVIDER_SITE_OTHER): Payer: Self-pay | Admitting: General Surgery

## 2011-06-07 NOTE — Telephone Encounter (Signed)
Rec'd call from Brett Davis (spouse), stating her husband, Brett Davis was out of pain medication and requested a refill. Confirmed he has a post op appointment on 06/11/11 with Dr. Derrell Lolling. Brett Davis stated he still had staples at incision site. Advised he could try extra strength tylenol in order to help with the discomfort he was feeling. After consulting with Arline Asp a nurse only appointment was offered for this afternoon in order to remove the staples at the incision site. Brett Davis mentioned this to her husband [name]) and he declined the offer and wants to just wait until Tuesday to be seen. Advised her to advise [place] if condition changes.

## 2011-06-11 ENCOUNTER — Encounter (INDEPENDENT_AMBULATORY_CARE_PROVIDER_SITE_OTHER): Payer: BC Managed Care – PPO | Admitting: General Surgery

## 2011-06-11 ENCOUNTER — Ambulatory Visit (INDEPENDENT_AMBULATORY_CARE_PROVIDER_SITE_OTHER): Payer: BC Managed Care – PPO | Admitting: General Surgery

## 2011-06-11 ENCOUNTER — Encounter (INDEPENDENT_AMBULATORY_CARE_PROVIDER_SITE_OTHER): Payer: Self-pay | Admitting: General Surgery

## 2011-06-11 VITALS — BP 182/102 | HR 72 | Temp 97.6°F | Resp 14 | Ht 70.0 in | Wt 231.4 lb

## 2011-06-11 DIAGNOSIS — Z9889 Other specified postprocedural states: Secondary | ICD-10-CM

## 2011-06-11 NOTE — Progress Notes (Signed)
Subjective:     Patient ID: Brett Davis, male   DOB: 1965/11/04, 45 y.o.   MRN: [id number]  HPI This patient underwent a laparoscopic reduction and repair of hiatal hernia and laparoscopic Nissen fundoplication on October 12. He is doing reasonably well from the surgery. He has significant comorbidities including obesity, tobacco abuse, or disease, asthma, and hypertension.  He is swallowing reasonably well. He is following the diet advancement according to the sheet. He's had a couple of episodes of minor dysphagia but when he backed off he is swallowing fine. No nausea or vomiting. Values have returned to normal. No abnormal pain. No fevers.  Review of Systems     Objective:   Physical Exam Constitutional: patient looks well he is in no distress  Lungs: clear to auscultation bilaterally.  Abdomen: soft flat nontender somewhat obese. Incisions all healing well. All staples removed.    Assessment:     Status post laparoscopic reduction and repair of hiatal hernia and laparoscopic Nissen fundoplication. He is doing well in the early postop period.  Hypertension  Asthma  Coronary artery disease  Tobacco abuse  Obesity.    Plan:     Advance diet and activities as advised.  Upper GI will be scheduled in about one month.  I will him in about 6 weeks.  Strongly encouraged to loose weight

## 2011-06-11 NOTE — Patient Instructions (Signed)
You seem to be doing well following your hiatal hernia surgery. You may resume all normal physical activities without restriction. Advance your diet as outlined in the guidelines. In one month we will ask you to get an upper GI  x-ray, and I'll see you in the office after that x-ray is done. Be sure to keep all of your appointment with your cardiologist and primary care physician.

## 2011-06-24 ENCOUNTER — Encounter (INDEPENDENT_AMBULATORY_CARE_PROVIDER_SITE_OTHER): Payer: Self-pay | Admitting: Cardiology

## 2011-06-26 ENCOUNTER — Encounter (INDEPENDENT_AMBULATORY_CARE_PROVIDER_SITE_OTHER): Payer: Self-pay | Admitting: Cardiology

## 2011-07-02 ENCOUNTER — Emergency Department (HOSPITAL_COMMUNITY): Payer: BC Managed Care – PPO

## 2011-07-02 ENCOUNTER — Other Ambulatory Visit: Payer: Self-pay

## 2011-07-02 ENCOUNTER — Encounter (HOSPITAL_COMMUNITY): Payer: Self-pay | Admitting: *Deleted

## 2011-07-02 ENCOUNTER — Emergency Department (HOSPITAL_COMMUNITY)
Admission: EM | Admit: 2011-07-02 | Discharge: 2011-07-03 | Disposition: A | Discharge status: Home / Self Care | Payer: BC Managed Care – PPO | Attending: Emergency Medicine | Admitting: Emergency Medicine

## 2011-07-02 DIAGNOSIS — Z79899 Other long term (current) drug therapy: Secondary | ICD-10-CM | POA: Insufficient documentation

## 2011-07-02 DIAGNOSIS — R079 Chest pain, unspecified: Secondary | ICD-10-CM | POA: Insufficient documentation

## 2011-07-02 DIAGNOSIS — I10 Essential (primary) hypertension: Secondary | ICD-10-CM | POA: Insufficient documentation

## 2011-07-02 DIAGNOSIS — E785 Hyperlipidemia, unspecified: Secondary | ICD-10-CM | POA: Insufficient documentation

## 2011-07-02 DIAGNOSIS — L989 Disorder of the skin and subcutaneous tissue, unspecified: Secondary | ICD-10-CM | POA: Insufficient documentation

## 2011-07-02 DIAGNOSIS — M129 Arthropathy, unspecified: Secondary | ICD-10-CM | POA: Insufficient documentation

## 2011-07-02 DIAGNOSIS — K219 Gastro-esophageal reflux disease without esophagitis: Secondary | ICD-10-CM | POA: Insufficient documentation

## 2011-07-02 LAB — CARDIAC PANEL(CRET KIN+CKTOT+MB+TROPI)
CK, MB: 4.2 ng/mL — ABNORMAL HIGH (ref 0.3–4.0)
Relative Index: 1.2 (ref 0.0–2.5)
Total CK: 341 U/L — ABNORMAL HIGH (ref 7–232)
Troponin I: <0.3 ng/mL (ref <0.30)

## 2011-07-02 LAB — DIFFERENTIAL
Basophils Absolute: 0.1 10*3/uL (ref 0.0–0.1)
Basophils Relative: 1 % (ref 0–1)
Eosinophils Absolute: 0.5 10*3/uL (ref 0.0–0.7)
Eosinophils Relative: 3 % (ref 0–5)
Lymphocytes Relative: 33 % (ref 12–46)
Lymphs Abs: 4.5 10*3/uL — ABNORMAL HIGH (ref 0.7–4.0)
Monocytes Absolute: 0.9 10*3/uL (ref 0.1–1.0)
Monocytes Relative: 7 % (ref 3–12)
Neutro Abs: 7.8 10*3/uL — ABNORMAL HIGH (ref 1.7–7.7)
Neutrophils Relative %: 57 % (ref 43–77)

## 2011-07-02 LAB — PROTIME-INR
INR: 1 (ref 0.00–1.49)
Prothrombin Time: 13.4 seconds (ref 11.6–15.2)

## 2011-07-02 LAB — CBC
HCT: 39.9 % (ref 39.0–52.0)
Hemoglobin: 13.3 g/dL (ref 13.0–17.0)
MCH: 31.7 pg (ref 26.0–34.0)
MCHC: 33.3 g/dL (ref 30.0–36.0)
MCV: 95.2 fL (ref 78.0–100.0)
Platelets: 278 10*3/uL (ref 150–400)
RBC: 4.19 MIL/uL — ABNORMAL LOW (ref 4.22–5.81)
RDW: 13.4 % (ref 11.5–15.5)
WBC: 13.8 10*3/uL — ABNORMAL HIGH (ref 4.0–10.5)

## 2011-07-02 NOTE — ED Notes (Signed)
The pt has had some lt sided chest with  Some lt arm radiation for 2-3 hours,. He had abd surgery 7 weeks ago and he just went back to work for the past 2 days.  He does exertional work.  At present he has some chest tightness. History of mi and stent placement

## 2011-07-03 ENCOUNTER — Other Ambulatory Visit: Payer: Self-pay

## 2011-07-03 LAB — CARDIAC PANEL(CRET KIN+CKTOT+MB+TROPI)
CK, MB: 3.1 ng/mL (ref 0.3–4.0)
Relative Index: 1.1 (ref 0.0–2.5)
Total CK: 292 U/L — ABNORMAL HIGH (ref 7–232)
Troponin I: <0.3 ng/mL (ref <0.30)

## 2011-07-03 NOTE — ED Provider Notes (Signed)
History    45yM with CP. Onset earlier this afternoon around 1600. 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 Pt with recent fundoplication and just returned to work. Denies abdominal pain. No n/v. No dyspnea. On my exam no complaints. Known CAD s/p stent. Per pt, had stress test prior to surgery and was told ok.   CSN: [id number] Arrival date & time: 07/02/2011  6:35 PM   First MD Initiated Contact with Patient 07/02/11 2302      Chief Complaint  Patient presents with  . Chest Pain    (Consider location/radiation/quality/duration/timing/severity/associated sxs/prior treatment) HPI  Past Medical History  Diagnosis Date  . Hypertension   . GERD (gastroesophageal reflux disease)   . Hiatal hernia   . Diverticulitis   . Hyperlipidemia   . History of endoscopy   . Abdominal pain   . Hiatal hernia   . Arthritis   . Generalized headaches   . Heart attack   . Dizziness - light-headed     Past Surgical History  Procedure Date  . Colon surgery     sigmoid colectomy for diverticulitis  . Upper endoscopy w/ esophageal manometry     History reviewed. No pertinent family history.  History  Substance Use Topics  . Smoking status: Current Everyday Smoker -- 0.5 packs/day  . Smokeless tobacco: Never Used  . Alcohol Use: No      Review of Systems   Review of symptoms negative unless otherwise noted in HPI.   Allergies  Review of patient's allergies indicates no known allergies.  Home Medications   Current Outpatient Rx  Name Route Sig Dispense Refill  . ALLOPURINOL 300 MG PO TABS Oral Take 300 mg by mouth daily as needed. For gout pain    . ALPRAZOLAM 0.5 MG PO TABS Oral Take 0.5 mg by mouth 3 (three) times daily as needed. For anxiety    . ASPIRIN 325 MG PO TABS Oral Take 325 mg by mouth daily.      . DESVENLAFAXINE SUCCINATE 50 MG PO TB24 Oral Take 50 mg by mouth daily.      . DEXLANSOPRAZOLE 60 MG PO CPDR Oral Take 60 mg by mouth daily.      Marland Kitchen  EZETIMIBE 10 MG PO TABS Oral Take 10 mg by mouth daily.      Marland Kitchen HYDROCHLOROTHIAZIDE 25 MG PO TABS Oral Take 25 mg by mouth daily.      . NEBIVOLOL HCL 5 MG PO TABS Oral Take 5 mg by mouth daily.      Marland Kitchen SIMVASTATIN 10 MG PO TABS Oral Take 10 mg by mouth at bedtime.      Marland Kitchen TEMAZEPAM 15 MG PO CAPS Oral Take 15 mg by mouth at bedtime as needed. For sleep    . NITROSTAT 0.4 MG SL SUBL Sublingual Place 0.4 mg under the tongue every 5 (five) minutes as needed. For chest pain      BP 149/93  Pulse 67  Temp(Src) 98.4 F (36.9 C) (Oral)  Resp 20  SpO2 95%  Physical Exam  Nursing note and vitals reviewed. Constitutional: He appears well-developed and well-nourished. No distress.  HENT:  Head: Normocephalic and atraumatic.  Eyes: Conjunctivae are normal. Right eye exhibits no discharge. Left eye exhibits no discharge.  Neck: Normal range of motion. Neck supple.  Cardiovascular: Normal rate, regular rhythm and normal heart sounds.  Exam reveals no gallop and no friction rub.   No murmur heard. Pulmonary/Chest: Effort normal and breath sounds normal.  No stridor. No respiratory distress. He exhibits no tenderness.  Abdominal: Soft. He exhibits no distension. There is no tenderness.  Musculoskeletal: He exhibits no edema and no tenderness.  Neurological: He is alert.  Skin: Skin is warm and dry. He is not diaphoretic.       Scaly skin lesions knees and elbows cosistent with psoriasis  Psychiatric: He has a normal mood and affect. His behavior is normal. Thought content normal.    ED Course  Procedures (including critical care time)  Labs Reviewed  CBC - Abnormal; Notable for the following:    WBC 13.8 (*)    RBC 4.19 (*)    All other components within normal limits  DIFFERENTIAL - Abnormal; Notable for the following:    Neutro Abs 7.8 (*)    Lymphs Abs 4.5 (*)    All other components within normal limits  CARDIAC PANEL(CRET KIN+CKTOT+MB+TROPI) - Abnormal; Notable for the following:     Total CK 341 (*)    CK, MB 4.2 (*)    All other components within normal limits  PROTIME-INR  CARDIAC PANEL(CRET KIN+CKTOT+MB+TROPI)   Dg Chest 2 View  07/02/2011  *RADIOLOGY REPORT*  Clinical Data: Chest pain.  CHEST - 2 VIEW  Comparison: Chest x-ray 05/15/2011.  Findings: The cardiac silhouette, mediastinal and hilar contours are within normal limits and stable.  The lungs are clear.  No pleural effusion.  The bony thorax is intact.  IMPRESSION: No acute cardiopulmonary findings.  Original Report Authenticated By: P. Loralie Champagne, M.D.   EKG:  Rhythm: normal sinus Rate: 73 Axis: normal Intervals: normal ST segments: normal  EKG: 12:44 AM  Rhythm: sinus brady Rate: 59 Axis: normal Intervals: normal ST segments: normal. Comparison: no significant change.    1. Chest pain       MDM  45yM with CP. Pt with known CAD, but initial EKG and repeat without concerning changes. Initial cardiac markers came back with mild elevation of CK-MB. This is not most specific test and repeat lower and within normal range. This is not consistent with MI. Trop neg x2 and second drawn 9 hours after symptom onset. Pt's symptoms resolved. Exam nonfocal. CXR clear. 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 Leukocytosis on unclear etiology, possible stress demargination. Doubt infectious etiology. Low clinical suspicion for PE.        Raeford Razor, MD 07/06/11 [phone]

## 2011-07-19 ENCOUNTER — Telehealth (INDEPENDENT_AMBULATORY_CARE_PROVIDER_SITE_OTHER): Payer: Self-pay | Admitting: General Surgery

## 2011-07-19 ENCOUNTER — Other Ambulatory Visit: Payer: BC Managed Care – PPO

## 2011-07-22 ENCOUNTER — Encounter (INDEPENDENT_AMBULATORY_CARE_PROVIDER_SITE_OTHER): Payer: BC Managed Care – PPO | Admitting: General Surgery

## 2011-08-01 ENCOUNTER — Telehealth (INDEPENDENT_AMBULATORY_CARE_PROVIDER_SITE_OTHER): Payer: Self-pay

## 2011-08-01 ENCOUNTER — Other Ambulatory Visit (INDEPENDENT_AMBULATORY_CARE_PROVIDER_SITE_OTHER): Payer: Self-pay | Admitting: General Surgery

## 2011-08-01 DIAGNOSIS — G8918 Other acute postprocedural pain: Secondary | ICD-10-CM

## 2011-08-01 NOTE — Telephone Encounter (Signed)
I reached pt re: r/s of his ugi. Pt states [facility] ran out of oral barium and was to call pt to r/s. Pt states he has not heard from [facility]. I called [facility] and set up ugi for 08-05-11. Pt advised of date of test. Pt wants to wait to sched f/u appt with Dr Derrell Lolling until we get ugi result back.

## 2011-08-05 ENCOUNTER — Other Ambulatory Visit (HOSPITAL_COMMUNITY): Payer: BC Managed Care – PPO

## 2011-09-03 ENCOUNTER — Ambulatory Visit
Admission: RE | Admit: 2011-09-03 | Discharge: 2011-09-03 | Disposition: A | Discharge status: Home / Self Care | Payer: BC Managed Care – PPO | Source: Ambulatory Visit | Attending: General Surgery | Admitting: General Surgery

## 2011-09-03 DIAGNOSIS — Z9889 Other specified postprocedural states: Secondary | ICD-10-CM

## 2012-07-21 ENCOUNTER — Encounter (HOSPITAL_COMMUNITY): Payer: Self-pay | Admitting: *Deleted

## 2012-07-21 ENCOUNTER — Observation Stay (HOSPITAL_COMMUNITY)
Admission: EM | Admit: 2012-07-21 | Discharge: 2012-07-22 | Disposition: A | Discharge status: Home / Self Care | Payer: BC Managed Care – PPO | Attending: Internal Medicine | Admitting: Internal Medicine

## 2012-07-21 ENCOUNTER — Other Ambulatory Visit: Payer: Self-pay

## 2012-07-21 DIAGNOSIS — E785 Hyperlipidemia, unspecified: Secondary | ICD-10-CM | POA: Insufficient documentation

## 2012-07-21 DIAGNOSIS — J441 Chronic obstructive pulmonary disease with (acute) exacerbation: Secondary | ICD-10-CM | POA: Insufficient documentation

## 2012-07-21 DIAGNOSIS — L4050 Arthropathic psoriasis, unspecified: Secondary | ICD-10-CM | POA: Insufficient documentation

## 2012-07-21 DIAGNOSIS — R079 Chest pain, unspecified: Principal | ICD-10-CM | POA: Insufficient documentation

## 2012-07-21 DIAGNOSIS — I2510 Atherosclerotic heart disease of native coronary artery without angina pectoris: Secondary | ICD-10-CM

## 2012-07-21 DIAGNOSIS — I10 Essential (primary) hypertension: Secondary | ICD-10-CM | POA: Insufficient documentation

## 2012-07-21 DIAGNOSIS — K219 Gastro-esophageal reflux disease without esophagitis: Secondary | ICD-10-CM | POA: Insufficient documentation

## 2012-07-21 DIAGNOSIS — Z79899 Other long term (current) drug therapy: Secondary | ICD-10-CM | POA: Insufficient documentation

## 2012-07-21 DIAGNOSIS — K5732 Diverticulitis of large intestine without perforation or abscess without bleeding: Secondary | ICD-10-CM | POA: Insufficient documentation

## 2012-07-21 DIAGNOSIS — M940 Chondrocostal junction syndrome [Tietze]: Secondary | ICD-10-CM | POA: Insufficient documentation

## 2012-07-21 DIAGNOSIS — F17200 Nicotine dependence, unspecified, uncomplicated: Secondary | ICD-10-CM | POA: Insufficient documentation

## 2012-07-21 DIAGNOSIS — J039 Acute tonsillitis, unspecified: Secondary | ICD-10-CM | POA: Insufficient documentation

## 2012-07-21 DIAGNOSIS — I252 Old myocardial infarction: Secondary | ICD-10-CM | POA: Insufficient documentation

## 2012-07-21 DIAGNOSIS — K5792 Diverticulitis of intestine, part unspecified, without perforation or abscess without bleeding: Secondary | ICD-10-CM

## 2012-07-21 DIAGNOSIS — J209 Acute bronchitis, unspecified: Secondary | ICD-10-CM | POA: Diagnosis present

## 2012-07-21 LAB — CBC WITH DIFFERENTIAL/PLATELET
Basophils Absolute: 0.1 K/uL (ref 0.0–0.1)
Basophils Relative: 1 % (ref 0–1)
Eosinophils Absolute: 0.5 K/uL (ref 0.0–0.7)
Eosinophils Relative: 4 % (ref 0–5)
HCT: 40 % (ref 39.0–52.0)
Hemoglobin: 13.6 g/dL (ref 13.0–17.0)
Lymphocytes Relative: 26 % (ref 12–46)
Lymphs Abs: 3.8 K/uL (ref 0.7–4.0)
MCH: 32.4 pg (ref 26.0–34.0)
MCHC: 34 g/dL (ref 30.0–36.0)
MCV: 95.2 fL (ref 78.0–100.0)
Monocytes Absolute: 1.4 K/uL — ABNORMAL HIGH (ref 0.1–1.0)
Monocytes Relative: 10 % (ref 3–12)
Neutro Abs: 9 K/uL — ABNORMAL HIGH (ref 1.7–7.7)
Neutrophils Relative %: 61 % (ref 43–77)
Platelets: 294 K/uL (ref 150–400)
RBC: 4.2 MIL/uL — ABNORMAL LOW (ref 4.22–5.81)
RDW: 13.5 % (ref 11.5–15.5)
WBC: 14.8 K/uL — ABNORMAL HIGH (ref 4.0–10.5)

## 2012-07-21 LAB — COMPREHENSIVE METABOLIC PANEL WITH GFR
ALT: 6 U/L (ref 0–53)
AST: 11 U/L (ref 0–37)
Albumin: 3.6 g/dL (ref 3.5–5.2)
Alkaline Phosphatase: 57 U/L (ref 39–117)
BUN: 11 mg/dL (ref 6–23)
CO2: 27 meq/L (ref 19–32)
Calcium: 9.1 mg/dL (ref 8.4–10.5)
Chloride: 100 meq/L (ref 96–112)
Creatinine, Ser: 0.94 mg/dL (ref 0.50–1.35)
GFR calc Af Amer: >90 mL/min
GFR calc non Af Amer: >90 mL/min
Glucose, Bld: 87 mg/dL (ref 70–99)
Potassium: 3.7 meq/L (ref 3.5–5.1)
Sodium: 136 meq/L (ref 135–145)
Total Bilirubin: 0.2 mg/dL — ABNORMAL LOW (ref 0.3–1.2)
Total Protein: 6.8 g/dL (ref 6.0–8.3)

## 2012-07-21 NOTE — ED Notes (Signed)
Report to Abbie RN

## 2012-07-21 NOTE — ED Notes (Signed)
Pt c/o chest pain x 30 mins prior to arrival; describes as pressure; c/o stiffness down neck; left arm numbness; previous history of MI with stent placement; took one nitro subling at home at 2145 with no change; states had back pain prior to this for one wk

## 2012-07-21 NOTE — ED Provider Notes (Signed)
History     CSN: [id number]  Arrival date & time 07/21/12  2213   First MD Initiated Contact with Patient 07/21/12 2254      No chief complaint on file.   (Consider location/radiation/quality/duration/timing/severity/associated sxs/prior treatment) HPI Comments: 46 year old male with a history of hypertension, coronary disease, hypercholesterolemia, tobacco use and myocardial infarction with a history of stent presents with a complaint of chest pain. This was acute in onset approximately one hour ago, left-sided, radiation to the shoulder and the neck, describes it as a pressure feeling. He thinks this is similar to his prior myocardial infarction. He took one nitroglycerin sublingually at home, there was no change in his pain. 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 The chest pain seems to get worse when he takes a deep breath, he has not been exercising last week because of his back pain so he is unsure if it is exertional. There is no fever, chills, cough, stomach pain, nausea, swelling, rashes. He did this her breath with the pain this evening, he still has very mild pain that waxes and wanes in intensity.  The history is provided by the patient, a relative and medical records.    Past Medical History  Diagnosis Date  . Hypertension   . GERD (gastroesophageal reflux disease)   . Hiatal hernia   . Diverticulitis   . Hyperlipidemia   . History of endoscopy   . Abdominal pain   . Hiatal hernia   . Arthritis   . Generalized headaches   . Heart attack   . Dizziness - light-headed     Past Surgical History  Procedure Date  . Colon surgery     sigmoid colectomy for diverticulitis  . Upper endoscopy w/ esophageal manometry     No family history on file.  History  Substance Use Topics  . Smoking status: Current Every Day Smoker -- 0.5 packs/day  . Smokeless tobacco:  Never Used  . Alcohol Use: No      Review of Systems  All other systems reviewed and are negative.    Allergies  Review of patient's allergies indicates no known allergies.  Home Medications   Current Outpatient Rx  Name  Route  Sig  Dispense  Refill  . ALPRAZOLAM 0.5 MG PO TABS   Oral   Take 0.5 mg by mouth 3 (three) times daily as needed. For anxiety         . ASPIRIN 325 MG PO TABS   Oral   Take 325 mg by mouth every morning.          . DESVENLAFAXINE SUCCINATE ER 50 MG PO TB24   Oral   Take 50 mg by mouth every morning.          Marland Kitchen HYDROCODONE-ACETAMINOPHEN 5-500 MG PO TABS   Oral   Take 1 tablet by mouth every 6 (six) hours as needed. For pain         . METHOTREXATE SODIUM CHEMO INJECTION 25 MG/ML PF   Injection   Inject 15 mg as directed every 7 (seven) days. Inject 0.34ml every Thursday for arthritis         . NEBIVOLOL HCL 5 MG PO TABS   Oral   Take 5 mg by mouth every morning.          Marland Kitchen NITROSTAT 0.4 MG SL SUBL  Sublingual   Place 0.4 mg under the tongue every 5 (five) minutes as needed. For chest pain         . SIMVASTATIN 10 MG PO TABS   Oral   Take 10 mg by mouth at bedtime.           Marland Kitchen TEMAZEPAM 15 MG PO CAPS   Oral   Take 15 mg by mouth at bedtime as needed. For sleep           BP 142/85  Pulse 69  Temp 97.7 F (36.5 C) (Oral)  Resp 22  Ht 5\' 10"  (1.778 m)  Wt 185 lb (83.915 kg)  BMI 26.54 kg/m2  SpO2 98%  Physical Exam  Nursing note and vitals reviewed. Constitutional: He appears well-developed and well-nourished. No distress.  HENT:  Head: Normocephalic and atraumatic.  Mouth/Throat: Oropharynx is clear and moist. No oropharyngeal exudate.  Eyes: Conjunctivae normal and EOM are normal. Pupils are equal, round, and reactive to light. Right eye exhibits no discharge. Left eye exhibits no discharge. No scleral icterus.  Neck: Normal range of motion. Neck supple. No JVD present. No thyromegaly present.   Cardiovascular: Normal rate, regular rhythm, normal heart sounds and intact distal pulses.  Exam reveals no gallop and no friction rub.   No murmur heard. Pulmonary/Chest: Effort normal and breath sounds normal. No respiratory distress. He has no wheezes. He has no rales.  Abdominal: Soft. Bowel sounds are normal. He exhibits no distension and no mass. There is no tenderness.  Musculoskeletal: Normal range of motion. He exhibits no edema and no tenderness.  Lymphadenopathy:    He has no cervical adenopathy.  Neurological: He is alert. Coordination normal.  Skin: Skin is warm and dry. No rash noted. No erythema.  Psychiatric: He has a normal mood and affect. His behavior is normal.    ED Course  Procedures (including critical care time)  Labs Reviewed  CBC WITH DIFFERENTIAL - Abnormal; Notable for the following:    WBC 14.8 (*)     RBC 4.20 (*)     Neutro Abs 9.0 (*)     Monocytes Absolute 1.4 (*)     All other components within normal limits  COMPREHENSIVE METABOLIC PANEL - Abnormal; Notable for the following:    Total Bilirubin 0.2 (*)     All other components within normal limits  PRO B NATRIURETIC PEPTIDE - Abnormal; Notable for the following:    Pro B Natriuretic peptide (BNP) 168.7 (*)     All other components within normal limits  POCT I-STAT TROPONIN I   No results found.   1. Chest pain       MDM  The patient's physical exam significant only for reproducible tenderness to the patient's left upper back. There is no reproducible tenderness in the chest. His EKG is normal and unchanged from one year ago and his vital signs remarkable for only mild hypertension. Labs pending, chest x-ray  ED ECG REPORT  I personally interpreted this EKG   Date: 07/22/2012   Rate: 65  Rhythm: normal sinus rhythm  QRS Axis: normal  Intervals: normal  ST/T Wave abnormalities: normal  Conduction Disutrbances:none  Narrative Interpretation:   Old EKG Reviewed: Compared with  07/03/2011, no changes seen   Care was discussed with the hospitalist, the troponin is negative, EKG does not show any ischemic changes, he still has mild pain, he has been given morphine and topical nitroglycerin, he will be admitted to the hospital to rule  out acute coronary syndrome.      Vida Roller, MD 07/22/12 [phone]

## 2012-07-22 ENCOUNTER — Encounter (HOSPITAL_COMMUNITY): Payer: Self-pay | Admitting: Family Medicine

## 2012-07-22 ENCOUNTER — Emergency Department (HOSPITAL_COMMUNITY): Payer: BC Managed Care – PPO

## 2012-07-22 DIAGNOSIS — E785 Hyperlipidemia, unspecified: Secondary | ICD-10-CM

## 2012-07-22 DIAGNOSIS — J209 Acute bronchitis, unspecified: Secondary | ICD-10-CM | POA: Diagnosis present

## 2012-07-22 DIAGNOSIS — R079 Chest pain, unspecified: Secondary | ICD-10-CM

## 2012-07-22 DIAGNOSIS — K5792 Diverticulitis of intestine, part unspecified, without perforation or abscess without bleeding: Secondary | ICD-10-CM

## 2012-07-22 DIAGNOSIS — L4050 Arthropathic psoriasis, unspecified: Secondary | ICD-10-CM

## 2012-07-22 DIAGNOSIS — I2510 Atherosclerotic heart disease of native coronary artery without angina pectoris: Secondary | ICD-10-CM

## 2012-07-22 LAB — CBC
HCT: 39.3 % (ref 39.0–52.0)
Hemoglobin: 13 g/dL (ref 13.0–17.0)
MCH: 31.6 pg (ref 26.0–34.0)
MCHC: 33.1 g/dL (ref 30.0–36.0)
RBC: 4.11 MIL/uL — ABNORMAL LOW (ref 4.22–5.81)

## 2012-07-22 LAB — BASIC METABOLIC PANEL
BUN: 10 mg/dL (ref 6–23)
Chloride: 103 mEq/L (ref 96–112)
GFR calc Af Amer: >90 mL/min (ref >90)
GFR calc non Af Amer: >90 mL/min (ref >90)
Glucose, Bld: 120 mg/dL — ABNORMAL HIGH (ref 70–99)
Potassium: 3.5 mEq/L (ref 3.5–5.1)
Sodium: 138 mEq/L (ref 135–145)

## 2012-07-22 LAB — TROPONIN I: Troponin I: <0.3 ng/mL (ref <0.30)

## 2012-07-22 LAB — LIPID PANEL
Cholesterol: 137 mg/dL (ref 0–200)
LDL Cholesterol: 83 mg/dL (ref 0–99)
Total CHOL/HDL Ratio: 4.6 RATIO
Triglycerides: 118 mg/dL (ref <150)
VLDL: 24 mg/dL (ref 0–40)

## 2012-07-22 MED ORDER — TEMAZEPAM 15 MG PO CAPS: 15.0000 mg | ORAL_CAPSULE | Freq: Every evening | ORAL | Status: DC | PRN

## 2012-07-22 MED ORDER — NITROGLYCERIN 2 % TD OINT
1.0000 [in_us] | TOPICAL_OINTMENT | Freq: Four times a day (QID) | TRANSDERMAL | Status: DC
  Administered 2012-07-22 (×2): 1 [in_us] via TOPICAL
  Filled 2012-07-22 (×2): qty 30

## 2012-07-22 MED ORDER — ZOLPIDEM TARTRATE 5 MG PO TABS: 5.0000 mg | ORAL_TABLET | Freq: Every evening | ORAL | Status: DC | PRN

## 2012-07-22 MED ORDER — DESVENLAFAXINE SUCCINATE ER 50 MG PO TB24
50.0000 mg | ORAL_TABLET | Freq: Every morning | ORAL | Status: DC
  Administered 2012-07-22: 50 mg via ORAL
  Filled 2012-07-22: qty 1

## 2012-07-22 MED ORDER — ALUM & MAG HYDROXIDE-SIMETH 200-200-20 MG/5ML PO SUSP: 30.0000 mL | Freq: Four times a day (QID) | ORAL | Status: DC | PRN

## 2012-07-22 MED ORDER — MORPHINE SULFATE 4 MG/ML IJ SOLN
4.0000 mg | Freq: Once | INTRAMUSCULAR | Status: AC
  Administered 2012-07-22: 4 mg via INTRAVENOUS
  Filled 2012-07-22: qty 1

## 2012-07-22 MED ORDER — HYDROCODONE-ACETAMINOPHEN 5-325 MG PO TABS
1.0000 | ORAL_TABLET | ORAL | Status: DC | PRN
  Administered 2012-07-22 (×3): 1 via ORAL
  Filled 2012-07-22 (×3): qty 1

## 2012-07-22 MED ORDER — TRAMADOL HCL 50 MG PO TABS
50.0000 mg | ORAL_TABLET | Freq: Three times a day (TID) | ORAL | Status: DC
  Administered 2012-07-22 (×2): 50 mg via ORAL
  Filled 2012-07-22 (×2): qty 1

## 2012-07-22 MED ORDER — NITROGLYCERIN 2 % TD OINT
0.5000 [in_us] | TOPICAL_OINTMENT | Freq: Four times a day (QID) | TRANSDERMAL | Status: DC
  Administered 2012-07-22: 0.5 [in_us] via TOPICAL
  Filled 2012-07-22: qty 30

## 2012-07-22 MED ORDER — IPRATROPIUM BROMIDE 0.02 % IN SOLN: 0.5000 mg | RESPIRATORY_TRACT | Status: DC | PRN

## 2012-07-22 MED ORDER — ALBUTEROL SULFATE (5 MG/ML) 0.5% IN NEBU: 2.5000 mg | INHALATION_SOLUTION | RESPIRATORY_TRACT | Status: DC | PRN

## 2012-07-22 MED ORDER — ASPIRIN 325 MG PO TABS
325.0000 mg | ORAL_TABLET | Freq: Every morning | ORAL | Status: DC
  Administered 2012-07-22: 325 mg via ORAL
  Filled 2012-07-22: qty 1

## 2012-07-22 MED ORDER — PREDNISONE 20 MG PO TABS
40.0000 mg | ORAL_TABLET | Freq: Every day | ORAL | Status: DC
  Administered 2012-07-22: 40 mg via ORAL
  Filled 2012-07-22 (×2): qty 2

## 2012-07-22 MED ORDER — BENZONATATE 100 MG PO CAPS
100.0000 mg | ORAL_CAPSULE | Freq: Three times a day (TID) | ORAL | Status: DC | PRN
  Filled 2012-07-22: qty 1

## 2012-07-22 MED ORDER — BENZONATATE 200 MG PO CAPS: 200.0000 mg | ORAL_CAPSULE | Freq: Three times a day (TID) | ORAL | Status: DC

## 2012-07-22 MED ORDER — SIMVASTATIN 10 MG PO TABS
10.0000 mg | ORAL_TABLET | Freq: Every day | ORAL | Status: DC
  Filled 2012-07-22: qty 1

## 2012-07-22 MED ORDER — SODIUM CHLORIDE 0.9 % IJ SOLN
3.0000 mL | Freq: Two times a day (BID) | INTRAMUSCULAR | Status: DC
  Administered 2012-07-22 (×2): 3 mL via INTRAVENOUS

## 2012-07-22 MED ORDER — METHYLPREDNISOLONE SODIUM SUCC 40 MG IJ SOLR
40.0000 mg | Freq: Two times a day (BID) | INTRAMUSCULAR | Status: DC
  Administered 2012-07-22 (×2): 40 mg via INTRAVENOUS
  Filled 2012-07-22 (×3): qty 1

## 2012-07-22 MED ORDER — ENOXAPARIN SODIUM 40 MG/0.4ML ~~LOC~~ SOLN
40.0000 mg | Freq: Every day | SUBCUTANEOUS | Status: DC
  Administered 2012-07-22: 40 mg via SUBCUTANEOUS
  Filled 2012-07-22: qty 0.4

## 2012-07-22 MED ORDER — LEVOFLOXACIN 500 MG PO TABS
500.0000 mg | ORAL_TABLET | Freq: Every day | ORAL | Status: DC
  Administered 2012-07-22: 500 mg via ORAL
  Filled 2012-07-22: qty 1

## 2012-07-22 MED ORDER — SODIUM CHLORIDE 0.9 % IJ SOLN: 3.0000 mL | INTRAMUSCULAR | Status: DC | PRN

## 2012-07-22 MED ORDER — ONDANSETRON HCL 4 MG/2ML IJ SOLN: 4.0000 mg | Freq: Four times a day (QID) | INTRAMUSCULAR | Status: DC | PRN

## 2012-07-22 MED ORDER — KETOROLAC TROMETHAMINE 15 MG/ML IJ SOLN
15.0000 mg | Freq: Once | INTRAMUSCULAR | Status: AC
  Administered 2012-07-22: 15 mg via INTRAVENOUS
  Filled 2012-07-22: qty 1

## 2012-07-22 MED ORDER — NICOTINE 14 MG/24HR TD PT24
14.0000 mg | MEDICATED_PATCH | Freq: Every day | TRANSDERMAL | Status: DC
  Administered 2012-07-22: 14 mg via TRANSDERMAL
  Filled 2012-07-22: qty 1

## 2012-07-22 MED ORDER — PREDNISONE 10 MG PO TABS: ORAL_TABLET | ORAL | Status: DC

## 2012-07-22 MED ORDER — NEBIVOLOL HCL 5 MG PO TABS
5.0000 mg | ORAL_TABLET | Freq: Every morning | ORAL | Status: DC
  Administered 2012-07-22: 5 mg via ORAL
  Filled 2012-07-22: qty 1

## 2012-07-22 MED ORDER — SENNOSIDES-DOCUSATE SODIUM 8.6-50 MG PO TABS
1.0000 | ORAL_TABLET | Freq: Every evening | ORAL | Status: DC | PRN
  Filled 2012-07-22: qty 1

## 2012-07-22 MED ORDER — ONDANSETRON HCL 4 MG PO TABS: 4.0000 mg | ORAL_TABLET | Freq: Four times a day (QID) | ORAL | Status: DC | PRN

## 2012-07-22 MED ORDER — MORPHINE SULFATE 2 MG/ML IJ SOLN: 2.0000 mg | INTRAMUSCULAR | Status: DC | PRN

## 2012-07-22 MED ORDER — ALPRAZOLAM 0.5 MG PO TABS: 0.5000 mg | ORAL_TABLET | Freq: Three times a day (TID) | ORAL | Status: DC | PRN

## 2012-07-22 MED ORDER — LEVOFLOXACIN 500 MG PO TABS: 500.0000 mg | ORAL_TABLET | Freq: Every day | ORAL | Status: DC

## 2012-07-22 MED ORDER — BENZONATATE 100 MG PO CAPS
200.0000 mg | ORAL_CAPSULE | Freq: Three times a day (TID) | ORAL | Status: DC
  Administered 2012-07-22 (×2): 200 mg via ORAL
  Filled 2012-07-22 (×3): qty 2

## 2012-07-22 NOTE — Discharge Summary (Signed)
Physician Discharge Summary  Brett Davis ZOX:[id number] DOB: 1966/05/30 DOA: 07/21/2012  PCP: Warrick Parisian, MD  Admit date: 07/21/2012 Discharge date: 07/22/2012  Time spent: 40 minutes  Recommendations for Outpatient Follow-up:  1. Follow up with primary MD. 2. Follow up with primary cardiologist.   Discharge Diagnoses:  Principal Problem:  *Chest pain Active Problems:  Psoriatic arthritis  CAD (coronary artery disease)  Diverticulitis  Hyperlipidemia   Discharge Condition: Satisfactory.   Diet recommendation: Heart-Healthy.   Filed Weights   07/21/12 2218 07/22/12 0211  Weight: 83.915 kg (185 lb) 87.7 kg (193 lb 5.5 oz)    History of present illness:  46 y/o gentleman with known HTN, GERD, Hiatal hernia, diverticulosis s/p sigmoid colectomy for diverticulitis, dyslipidemia, psoriatic arthritis for which he gets a Methotrexate injection once a week, CAD and stent placement 3 years prior. He states he has been having generalized chest and back pain for 3 weeks. In the last 2-3 days however, he developed left sided chest wall pain, and on 07/22/12, it became worse, radiated down his left arm and up his neck. NTG did not help. He states it reminded him of when he had his MI, he came to the ED. He describes the pain as vice like pressure. He has pain with deep breathing, a cough, and wheeze. He see's his cardiologist once annually. His last stress test was over a year ago. He states he is compliant with his medication. Patient was admitted for further evaluation and management.    Hospital Course:  1. Chest pain: Patient has known history of CAD, s/p previous MI, s/p Stent. He presented with atypical symptoms on this occasion, over the last 2-3 days. 12-Lead EKG was devoid of acute ischemic changes, and cardiac enzymes remaind un-elevated. In AM of 07/22/12, left-sided chest pain was clearly reproducible on palpation, and movement, and exacerbated by deep  inspiration/cough. Likely musculoskeletal pain vs pleurisy. D-Dimer was fortunately, negative at <0.27. CXR was negative for acute disease. On ASA. A single dose of Toradol was administered by admitting MD, with satisfactory effect. Patient will follow up with primary cardiologist for possible stress testing, on discharge, if indicated. Patien'ts cardiologist is at [facility], at [facility], and patient is agreeable to this plan.  2. Tobacco abuse: Patient admittedly, smokes 10-15 cigarettes per day. He has been counseled appropriately, and was managed with Nicoderm CQ patch.  3. COPD exacerbation: Patient had a few days of sore throat, cough, productive of yellow phlegm, as well as left-sided pleuritic type chest pain, and a leukocytosis. This is consistent with an acute bronchitis. Managed with a 7-day course of Levaquin. No evidence clinically, of acute exacerbation at this time. On rapid oral steroid taper.  4. Acute Tonsillitis: This is an incidental finding on physical exam, which revealed mild left tonsillar enlargement with exudate. Patient admits to a sore throat, over the last few days. Adequately addressed with Levaquin.  5. Hyperlipidemia: Lipid profile shows TC 137, TG 118, HDL 30 and LDL 83. Continued on statin.  6. Hypertension: BP is reasonably controlled on pre-admission anti-hypertensives.  7. Psoriatic arthritis: Stable on Methotrexate.   Procedures:  See Below.   Consultations:  N/A.   Discharge Exam: Filed Vitals:   07/22/12 0128 07/22/12 0211 07/22/12 0552 07/22/12 0938  BP: 159/86 154/85 162/87 140/80  Pulse:  65 59 70  Temp:  98.8 F (37.1 C) 98.1 F (36.7 C)   TempSrc:  Oral Oral   Resp:  19 19   Height:  Weight:  87.7 kg (193 lb 5.5 oz)    SpO2:  98% 95%     General: Comfortable, alert, communicative, fully oriented, not short of breath at rest.  HEENT: No clinical pallor, no jaundice, no conjunctival injection or discharge. Hydration is fair.  NECK:  Supple, JVP not seen, no carotid bruits, no palpable lymphadenopathy, no palpable goiter.  CHEST: Clinically clear to auscultation, no wheezes, no crackles. Tenderness to palpation of precordial area.  HEART: Sounds 1 and 2 heard, normal, regular, no murmurs.  ABDOMEN: Full, soft, non-tender, no palpable organomegaly, no palpable masses, normal bowel sounds.  GENITALIA: Not examined.  LOWER EXTREMITIES: No pitting edema, palpable peripheral pulses.  MUSCULOSKELETAL SYSTEM: Unremarkable.  CENTRAL NERVOUS SYSTEM: No focal neurologic deficit on gross examination.  Discharge Instructions      Discharge Orders    Future Orders Please Complete By Expires   Diet - low sodium heart healthy      Increase activity slowly          Medication List     As of 07/22/2012  4:43 PM    TAKE these medications         ALPRAZolam 0.5 MG tablet   Commonly known as: XANAX   Take 0.5 mg by mouth 3 (three) times daily as needed. For anxiety      aspirin 325 MG tablet   Take 325 mg by mouth every morning.      benzonatate 200 MG capsule   Commonly known as: TESSALON   Take 1 capsule (200 mg total) by mouth 3 (three) times daily.      desvenlafaxine 50 MG 24 hr tablet   Commonly known as: PRISTIQ   Take 50 mg by mouth every morning.      HYDROcodone-acetaminophen 5-500 MG per tablet   Commonly known as: VICODIN   Take 1 tablet by mouth every 6 (six) hours as needed. For pain      levofloxacin 500 MG tablet   Commonly known as: LEVAQUIN   Take 1 tablet (500 mg total) by mouth daily.      methotrexate 25 MG/ML Soln   Inject 15 mg as directed every 7 (seven) days. Inject 0.88ml every Thursday for arthritis      nebivolol 5 MG tablet   Commonly known as: BYSTOLIC   Take 5 mg by mouth every morning.      NITROSTAT 0.4 MG SL tablet   Generic drug: nitroGLYCERIN   Place 0.4 mg under the tongue every 5 (five) minutes as needed. For chest pain      predniSONE 10 MG tablet   Commonly known as:  DELTASONE   Take 40 mg daily for 2 days, then 30 mg daily for 2 days, then 20 mg daily for 2 days, then 10 mg daily for 2 days, then stop.      simvastatin 10 MG tablet   Commonly known as: ZOCOR   Take 10 mg by mouth at bedtime.      temazepam 15 MG capsule   Commonly known as: RESTORIL   Take 15 mg by mouth at bedtime as needed. For sleep        Follow-up Information    Follow up with Warrick Parisian, MD.   Contact information:   [address] [place] [address] [phone]       Call to follow up. (Follow up with primary cardiologist, this week. )           The results  of significant diagnostics from this hospitalization (including imaging, microbiology, ancillary and laboratory) are listed below for reference.    Significant Diagnostic Studies: Dg Chest Port 1 View  07/22/2012  *RADIOLOGY REPORT*  Clinical Data: Chest pain  PORTABLE CHEST - 1 VIEW  Comparison: 07/02/2011  Findings: Lungs are clear. No pleural effusion or pneumothorax.  Mild cardiomegaly.  IMPRESSION: No evidence of acute cardiopulmonary disease.   Original Report Authenticated By: Charline Bills, M.D.     Microbiology: No results found for this or any previous visit (from the past 240 hour(s)).   Labs: Basic Metabolic Panel:  Lab 07/22/12 1610 07/21/12 2140  NA 138 136  K 3.5 3.7  CL 103 100  CO2 26 27  GLUCOSE 120* 87  BUN 10 11  CREATININE 0.86 0.94  CALCIUM 8.9 9.1  MG -- --  PHOS -- --   Liver Function Tests:  Lab 07/21/12 2140  AST 11  ALT 6  ALKPHOS 57  BILITOT 0.2*  PROT 6.8  ALBUMIN 3.6   No results found for this basename: LIPASE:5,AMYLASE:5 in the last 168 hours No results found for this basename: AMMONIA:5 in the last 168 hours CBC:  Lab 07/22/12 0340 07/21/12 2140  WBC 13.8* 14.8*  NEUTROABS -- 9.0*  HGB 13.0 13.6  HCT 39.3 40.0  MCV 95.6 95.2  PLT 286 294   Cardiac Enzymes:  Lab 07/22/12 1445 07/22/12 0829 07/22/12 0340  CKTOTAL -- -- --   CKMB -- -- --  CKMBINDEX -- -- --  TROPONINI <0.30 <0.30 <0.30   BNP: BNP (last 3 results)  Basename 07/21/12 2236  PROBNP 168.7*   CBG: No results found for this basename: GLUCAP:5 in the last 168 hours     Signed:  Irish Piech,[name]  [facility] 07/22/2012, 4:43 PM

## 2012-07-22 NOTE — H&P (Signed)
PCP:   Warrick Parisian, MD   Chief Complaint:  Chest pain  HPI: This is a 46 y/o gentleman with known CAD and stent placement 3 years prior. He states he has been having generalized chest and back pain for 3 weeks. He has a h/o psoriatic arthritis for which he gets a metotrexate shot q3wks. This week however, he developed left sided chest wall pain, today it became worse, radiated down his left arm and up his neck. Nitro did not help. He states it reminded him of when he had his MI, he came to the ER. He describes the pain as vice like pressure. He has pain with deep breathing, he has a cough, he does wheeze. He denies fever, chills, nausea, vomiting, constipation or burning on urination. History provided by the patient. No palpitations, mild SOB. He see's his cardiologist once annually. His last stress test was over a year ago. He states he is compliant with his medication.   Review of Systems:  The patient denies anorexia, fever, weight loss,, vision loss, decreased hearing, hoarseness, syncope, dyspnea on exertion, peripheral edema, balance deficits, hemoptysis, abdominal pain, melena, hematochezia, severe indigestion/heartburn, hematuria, incontinence, genital sores, muscle weakness, suspicious skin lesions, transient blindness, difficulty walking, depression, unusual weight change, abnormal bleeding, enlarged lymph nodes, angioedema, and breast masses.  Past Medical History: Past Medical History  Diagnosis Date  . Hypertension   . GERD (gastroesophageal reflux disease)   . Hiatal hernia   . Diverticulitis   . Hyperlipidemia   . History of endoscopy   . Abdominal pain   . Hiatal hernia   . Arthritis   . Generalized headaches   . Heart attack   . Dizziness - light-headed    Past Surgical History  Procedure Date  . Colon surgery     sigmoid colectomy for diverticulitis  . Upper endoscopy w/ esophageal manometry     Medications: Prior to Admission medications   Medication Sig  Start Date End Date Taking? Authorizing Provider  ALPRAZolam Prudy Feeler) 0.5 MG tablet Take 0.5 mg by mouth 3 (three) times daily as needed. For anxiety   Yes Historical Provider, MD  aspirin 325 MG tablet Take 325 mg by mouth every morning.    Yes Historical Provider, MD  desvenlafaxine (PRISTIQ) 50 MG 24 hr tablet Take 50 mg by mouth every morning.    Yes Historical Provider, MD  HYDROcodone-acetaminophen (VICODIN) 5-500 MG per tablet Take 1 tablet by mouth every 6 (six) hours as needed. For pain   Yes Historical Provider, MD  methotrexate 25 MG/ML SOLN Inject 15 mg as directed every 7 (seven) days. Inject 0.23ml every Thursday for arthritis   Yes Historical Provider, MD  nebivolol (BYSTOLIC) 5 MG tablet Take 5 mg by mouth every morning.    Yes Historical Provider, MD  NITROSTAT 0.4 MG SL tablet Place 0.4 mg under the tongue every 5 (five) minutes as needed. For chest pain 03/13/11  Yes Historical Provider, MD  simvastatin (ZOCOR) 10 MG tablet Take 10 mg by mouth at bedtime.     Yes Historical Provider, MD  temazepam (RESTORIL) 15 MG capsule Take 15 mg by mouth at bedtime as needed. For sleep 06/03/11  Yes Historical Provider, MD    Allergies:  No Known Allergies  Social History:  reports that he has been smoking.  He has never used smokeless tobacco. He reports that he does not drink alcohol or use illicit drugs.  Family History: CAD, HTN, DM  Physical Exam: Filed Vitals:  07/22/12 0045 07/22/12 0100 07/22/12 0115 07/22/12 0128  BP:    159/86  Pulse: 66 68 68   Temp:      TempSrc:      Resp: 15 19 22    Height:      Weight:      SpO2: 94% 94% 94%     General:  Alert and oriented times three, well developed and nourished, no acute distress Eyes: PERRLA, pink conjunctiva, no scleral icterus ENT: Moist oral mucosa, neck supple, no thyromegaly Lungs: clear to ascultation, mild wheezing, no crackles, no use of accessory muscles Cardiovascular: regular rate and rhythm, no regurgitation,  no gallops, no murmurs. No carotid bruits, no JVD Abdomen: soft, positive BS, non-tender, non-distended, no organomegaly, not an acute abdomen GU: not examined Neuro: CN II - XII grossly intact, sensation intact Musculoskeletal: strength 5/5 all extremities, no clubbing, cyanosis or edema, very reproducible chest wall pain as well as back pain Skin: no rash, no subcutaneous crepitation, no decubitus Psych: appropriate patient   Labs on Admission:   [facility] 07/21/12 2140  NA 136  K 3.7  CL 100  CO2 27  GLUCOSE 87  BUN 11  CREATININE 0.94  CALCIUM 9.1  MG --  PHOS --    Basename 07/21/12 2140  AST 11  ALT 6  ALKPHOS 57  BILITOT 0.2*  PROT 6.8  ALBUMIN 3.6   No results found for this basename: LIPASE:2,AMYLASE:2 in the last 72 hours  Basename 07/21/12 2140  WBC 14.8*  NEUTROABS 9.0*  HGB 13.6  HCT 40.0  MCV 95.2  PLT 294  Results for DREXEL, IVEY (MRN [id number]) as of 07/22/2012 01:56  Ref. Range 07/21/2012 22:36 07/21/2012 22:41  Troponin i, poc Latest Range: 0.00-0.08 ng/mL  0.00  Pro B Natriuretic peptide (BNP) Latest Range: 0-125 pg/mL 168.7 (H)     Micro Results: No results found for this or any previous visit (from the past 240 hour(s)).   Radiological Exams on Admission: Dg Chest Port 1 View  07/22/2012  *RADIOLOGY REPORT*  Clinical Data: Chest pain  PORTABLE CHEST - 1 VIEW  Comparison: 07/02/2011  Findings: Lungs are clear. No pleural effusion or pneumothorax.  Mild cardiomegaly.  IMPRESSION: No evidence of acute cardiopulmonary disease.   Original Report Authenticated By: Charline Bills, M.D.    EKG: NSR  Assessment/Plan Present on Admission:  Chest pain Psoriatic arthritis costrochondritis Bring in for 23 hr observation on tele Likely musculoskeletal pain but given h/o CAD and psoriatic arthritis with its chronic pain issue, will r/o with 3 sets of nitro.  ASA, nitro, lipid panel in AM Single shot of toradol ordered Defer to AM team  to consult cardiology. Patients cardiologist is with [facility]. CAD Tobacco abuse Mild COPd exacerbation Nicotine patch duonebs as needed Solumedrol IV and tessalon pearles Hyperlipidemia Hypertension Resume home med, stable  Full code DVT prophylaxis   Su Duma 07/22/2012, 1:55 AM

## 2012-07-22 NOTE — Care Management Note (Signed)
    Page 1 of 1   07/22/2012     3:44:42 PM   CARE MANAGEMENT NOTE 07/22/2012  Patient:  Brett Davis, Brett Davis   Account Number:  [id number]  Date Initiated:  07/22/2012  Documentation initiated by:  Lanier Clam  Subjective/Objective Assessment:   ADMITTED W/CHEST PAIN.     Action/Plan:   FROM HOME.HAS PCP,PHARMACY.   Anticipated DC Date:  07/22/2012   Anticipated DC Plan:  HOME/SELF CARE      DC Planning Services  CM consult      Choice offered to / List presented to:             Status of service:  Completed, signed off Medicare Important Message given?   (If response is "NO", the following Medicare IM given date fields will be blank) Date Medicare IM given:   Date Additional Medicare IM given:    Discharge Disposition:  HOME/SELF CARE  Per UR Regulation:  Reviewed for med. necessity/level of care/duration of stay  If discussed at Long Length of Stay Meetings, dates discussed:    Comments:  07/22/12 Cherl Gorney RN,BSN NCM [phone] NO ANTICIPATED D/C NEEDS.

## 2012-07-22 NOTE — Progress Notes (Signed)
[facility] PROGRESS NOTE  Brett Davis ZOX:[id number] DOB: 1965/12/13 DOA: 07/21/2012 PCP: Warrick Parisian, MD  Assessment/Plan: Principal Problem:  *Chest pain Active Problems:  Psoriatic arthritis  CAD (coronary artery disease)  Diverticulitis  Hyperlipidemia    1. Chest pain: Patient has known history of CAD, s/p previous MI, s/p Stent. He has presented with atypical symptoms on this occasion, over the last 2-3 days. 12-Lead EKG is devoid of acute ischemic changes, and cardiac enzymes are un-elevated. This AM, left-sided chest pain is clearly reproducible on palpation, and movement, and exacerbated by dep inspiration/cough. Likely musculoskeletal pain vs pleurisy. Will check D-Dimer, to exclude PE. CXR is negative for acute disease. On ASA. A single dose of Toradol was administered by admitting MD. If no new developments and patient remains stable, he will follow up with primary cardiologist for possible stress testing, on discharge. Patients cardiologist is at [facility], at [facility].  2. Tobacco abuse: Patient admittedly, smokes 10-15 cigarettes per day. He has been counseled appropriately, and placed on Nicoderm CQ patch.   3. COPD exacerbation: Patient has a few days of sore throat, cough, productive of yellow phlegm, as well as left-sided pleuritic type chest pain, and a leukocytosis. This iis consistent with an acute bronchitis. Managing with Levaquin and bronchodilators. No evidence clinically, of acute exacerbation at this time. Will transition to oral steroid taper.  4. Acute Tonsillitis: This is an incidental finding on physical exam, which revealed mild left tonsillar enlargement with exudate. Patient admits to a sore throat, over the last few days. Adequately addressed with Levaquin.  5. Hyperlipidemia:  Lipid profile shows TC 137, TG 118, HDL 30 and LDL 83. Continued on statin.  6. Hypertension: BP is reasonably controlled on pre-admission anti-hypertensives.   7. Psoriatic arthritis: Stable on Methotrexate.      Code Status: Full Code.  Family Communication:  Disposition Plan: To be determined. Possible discharge later today, or in AM 07/23/12.    Brief narrative: 46 y/o gentleman with known HTN, GERD, Hiatal hernia, diverticulosis s/p sigmoid colectomy for diverticulitis, dyslipidemia, psoriatic arthritis for which he gets a Methotrexate injection once a week, CAD and stent placement 3 years prior. He states he has been having generalized chest and back pain for 3 weeks. In the last 2-3 days however, he developed left sided chest wall pain, and on 07/22/12, it became worse, radiated down his left arm and up his neck. NTG did not help. He states it reminded him of when he had his MI, he came to the ED. He describes the pain as vice like pressure. He has pain with deep breathing, a cough, and wheeze. He see's his cardiologist once annually. His last stress test was over a year ago. He states he is compliant with his medication. Patient was admitted for further evaluation and management.      Consultants:  N/A.   Procedures:  CXR  Antibiotics:  N/A.   HPI/Subjective: Feels better.   Objective: Vital signs in last 24 hours: Temp:  [97.7 F (36.5 C)-98.8 F (37.1 C)] 98.1 F (36.7 C) (12/18 0552) Pulse Rate:  [59-72] 70  (12/18 0938) Resp:  [14-22] 19  (12/18 0552) BP: (140-162)/(80-87) 140/80 mmHg (12/18 0938) SpO2:  [94 %-99 %] 95 % (12/18 0552) Weight:  [83.915 kg (185 lb)-87.7 kg (193 lb 5.5 oz)] 87.7 kg (193 lb 5.5 oz) (12/18 0211) Weight change:  Last BM Date: 07/21/12  Intake/Output from previous day:       Physical Exam: General:  Comfortable, alert, communicative, fully oriented, not short of breath at rest.  HEENT:  No clinical pallor, no jaundice, no conjunctival injection or discharge. Hydration is fair.  NECK:  Supple, JVP not seen, no carotid bruits, no palpable lymphadenopathy, no palpable goiter. CHEST:   Clinically clear to auscultation, no wheezes, no crackles. Tenderness to palpation of precordial area.  HEART:  Sounds 1 and 2 heard, normal, regular, no murmurs. ABDOMEN:  Full, soft, non-tender, no palpable organomegaly, no palpable masses, normal bowel sounds. GENITALIA:  Not examined. LOWER EXTREMITIES:  No pitting edema, palpable peripheral pulses. MUSCULOSKELETAL SYSTEM:  Unremarkable. CENTRAL NERVOUS SYSTEM:  No focal neurologic deficit on gross examination.  Lab Results:  Basename 07/22/12 0340 07/21/12 2140  WBC 13.8* 14.8*  HGB 13.0 13.6  HCT 39.3 40.0  PLT 286 294    Basename 07/22/12 0340 07/21/12 2140  NA 138 136  K 3.5 3.7  CL 103 100  CO2 26 27  GLUCOSE 120* 87  BUN 10 11  CREATININE 0.86 0.94  CALCIUM 8.9 9.1   No results found for this or any previous visit (from the past 240 hour(s)).   Studies/Results: Dg Chest Port 1 View  07/22/2012  *RADIOLOGY REPORT*  Clinical Data: Chest pain  PORTABLE CHEST - 1 VIEW  Comparison: 07/02/2011  Findings: Lungs are clear. No pleural effusion or pneumothorax.  Mild cardiomegaly.  IMPRESSION: No evidence of acute cardiopulmonary disease.   Original Report Authenticated By: Charline Bills, M.D.     Medications: Scheduled Meds:   . aspirin  325 mg Oral q morning - 10a  . desvenlafaxine  50 mg Oral q morning - 10a  . enoxaparin (LOVENOX) injection  40 mg Subcutaneous Daily  . methylPREDNISolone (SOLU-MEDROL) injection  40 mg Intravenous BID  . nebivolol  5 mg Oral q morning - 10a  . nicotine  14 mg Transdermal Daily  . nitroGLYCERIN  1 inch Topical Q6H  . simvastatin  10 mg Oral QHS  . sodium chloride  3 mL Intravenous Q12H   Continuous Infusions:  PRN Meds:.albuterol, ALPRAZolam, alum & mag hydroxide-simeth, benzonatate, HYDROcodone-acetaminophen, ipratropium, morphine injection, ondansetron (ZOFRAN) IV, ondansetron, senna-docusate, sodium chloride, zolpidem    LOS: 1 day   [name],[name]  [facility] Pager [phone]. If 8PM-8AM, please contact night-coverage at [email or web address], password [facility] 07/22/2012, 10:44 AM  LOS: 1 day

## 2012-10-07 ENCOUNTER — Encounter: Payer: Self-pay | Admitting: *Deleted

## 2012-11-18 ENCOUNTER — Other Ambulatory Visit: Payer: Self-pay | Admitting: Gastroenterology

## 2012-11-18 DIAGNOSIS — R1032 Left lower quadrant pain: Secondary | ICD-10-CM

## 2012-11-20 ENCOUNTER — Ambulatory Visit
Admission: RE | Admit: 2012-11-20 | Discharge: 2012-11-20 | Disposition: A | Discharge status: Home / Self Care | Payer: BC Managed Care – PPO | Source: Ambulatory Visit | Attending: Gastroenterology | Admitting: Gastroenterology

## 2012-11-20 DIAGNOSIS — R1032 Left lower quadrant pain: Secondary | ICD-10-CM

## 2012-11-20 MED ORDER — IOHEXOL 300 MG/ML  SOLN
100.0000 mL | Freq: Once | INTRAMUSCULAR | Status: AC | PRN
  Administered 2012-11-20: 100 mL via INTRAVENOUS

## 2012-12-13 ENCOUNTER — Emergency Department (HOSPITAL_COMMUNITY): Payer: BC Managed Care – PPO

## 2012-12-13 ENCOUNTER — Encounter (HOSPITAL_COMMUNITY): Payer: Self-pay | Admitting: *Deleted

## 2012-12-13 ENCOUNTER — Inpatient Hospital Stay (HOSPITAL_COMMUNITY)
Admission: EM | Admit: 2012-12-13 | Discharge: 2012-12-18 | DRG: 122 | Disposition: A | Discharge status: Home / Self Care | Payer: BC Managed Care – PPO | Attending: Cardiology | Admitting: Cardiology

## 2012-12-13 DIAGNOSIS — K219 Gastro-esophageal reflux disease without esophagitis: Secondary | ICD-10-CM | POA: Diagnosis present

## 2012-12-13 DIAGNOSIS — I214 Non-ST elevation (NSTEMI) myocardial infarction: Principal | ICD-10-CM

## 2012-12-13 DIAGNOSIS — Z7982 Long term (current) use of aspirin: Secondary | ICD-10-CM

## 2012-12-13 DIAGNOSIS — E119 Type 2 diabetes mellitus without complications: Secondary | ICD-10-CM | POA: Diagnosis present

## 2012-12-13 DIAGNOSIS — R509 Fever, unspecified: Secondary | ICD-10-CM | POA: Diagnosis present

## 2012-12-13 DIAGNOSIS — R61 Generalized hyperhidrosis: Secondary | ICD-10-CM | POA: Diagnosis present

## 2012-12-13 DIAGNOSIS — F3289 Other specified depressive episodes: Secondary | ICD-10-CM | POA: Diagnosis present

## 2012-12-13 DIAGNOSIS — F329 Major depressive disorder, single episode, unspecified: Secondary | ICD-10-CM | POA: Diagnosis present

## 2012-12-13 DIAGNOSIS — I252 Old myocardial infarction: Secondary | ICD-10-CM

## 2012-12-13 DIAGNOSIS — K5732 Diverticulitis of large intestine without perforation or abscess without bleeding: Secondary | ICD-10-CM | POA: Diagnosis present

## 2012-12-13 DIAGNOSIS — K5792 Diverticulitis of intestine, part unspecified, without perforation or abscess without bleeding: Secondary | ICD-10-CM

## 2012-12-13 DIAGNOSIS — I2510 Atherosclerotic heart disease of native coronary artery without angina pectoris: Secondary | ICD-10-CM | POA: Diagnosis present

## 2012-12-13 DIAGNOSIS — D72829 Elevated white blood cell count, unspecified: Secondary | ICD-10-CM | POA: Diagnosis present

## 2012-12-13 DIAGNOSIS — L4050 Arthropathic psoriasis, unspecified: Secondary | ICD-10-CM | POA: Diagnosis present

## 2012-12-13 DIAGNOSIS — R079 Chest pain, unspecified: Secondary | ICD-10-CM

## 2012-12-13 DIAGNOSIS — I10 Essential (primary) hypertension: Secondary | ICD-10-CM | POA: Diagnosis present

## 2012-12-13 DIAGNOSIS — I498 Other specified cardiac arrhythmias: Secondary | ICD-10-CM | POA: Diagnosis present

## 2012-12-13 DIAGNOSIS — F411 Generalized anxiety disorder: Secondary | ICD-10-CM | POA: Diagnosis present

## 2012-12-13 DIAGNOSIS — I309 Acute pericarditis, unspecified: Secondary | ICD-10-CM

## 2012-12-13 DIAGNOSIS — F17200 Nicotine dependence, unspecified, uncomplicated: Secondary | ICD-10-CM | POA: Diagnosis present

## 2012-12-13 DIAGNOSIS — E785 Hyperlipidemia, unspecified: Secondary | ICD-10-CM | POA: Diagnosis present

## 2012-12-13 HISTORY — DX: Non-ST elevation (NSTEMI) myocardial infarction: I21.4

## 2012-12-13 HISTORY — DX: Nicotine dependence, unspecified, uncomplicated: F17.200

## 2012-12-13 LAB — BASIC METABOLIC PANEL
BUN: 11 mg/dL (ref 6–23)
CO2: 28 mEq/L (ref 19–32)
Calcium: 9 mg/dL (ref 8.4–10.5)
Creatinine, Ser: 0.9 mg/dL (ref 0.50–1.35)
Glucose, Bld: 112 mg/dL — ABNORMAL HIGH (ref 70–99)

## 2012-12-13 LAB — CBC
HCT: 37.2 % — ABNORMAL LOW (ref 39.0–52.0)
MCH: 32.6 pg (ref 26.0–34.0)
MCV: 95.4 fL (ref 78.0–100.0)
Platelets: 262 10*3/uL (ref 150–400)
RBC: 3.9 MIL/uL — ABNORMAL LOW (ref 4.22–5.81)

## 2012-12-13 LAB — PRO B NATRIURETIC PEPTIDE: Pro B Natriuretic peptide (BNP): 1212 pg/mL — ABNORMAL HIGH (ref 0–125)

## 2012-12-13 LAB — POCT I-STAT TROPONIN I: Troponin i, poc: 20.72 ng/mL (ref 0.00–0.08)

## 2012-12-13 MED ORDER — NITROGLYCERIN 0.4 MG SL SUBL: 0.4000 mg | SUBLINGUAL_TABLET | SUBLINGUAL | Status: DC | PRN

## 2012-12-13 MED ORDER — HYDROCODONE-ACETAMINOPHEN 5-325 MG PO TABS
1.0000 | ORAL_TABLET | ORAL | Status: DC | PRN
  Administered 2012-12-14 – 2012-12-18 (×9): 1 via ORAL
  Filled 2012-12-13 (×10): qty 1

## 2012-12-13 MED ORDER — ACETAMINOPHEN 325 MG PO TABS
650.0000 mg | ORAL_TABLET | ORAL | Status: DC | PRN
  Administered 2012-12-14: 650 mg via ORAL
  Filled 2012-12-13: qty 2

## 2012-12-13 MED ORDER — NEBIVOLOL HCL 5 MG PO TABS
5.0000 mg | ORAL_TABLET | Freq: Every morning | ORAL | Status: DC
  Administered 2012-12-14 – 2012-12-18 (×5): 5 mg via ORAL
  Filled 2012-12-13 (×5): qty 1

## 2012-12-13 MED ORDER — ONDANSETRON HCL 4 MG/2ML IJ SOLN: 4.0000 mg | Freq: Four times a day (QID) | INTRAMUSCULAR | Status: DC | PRN

## 2012-12-13 MED ORDER — ACETAMINOPHEN 325 MG PO TABS
650.0000 mg | ORAL_TABLET | Freq: Once | ORAL | Status: AC
  Administered 2012-12-13: 650 mg via ORAL
  Filled 2012-12-13: qty 2

## 2012-12-13 MED ORDER — ALPRAZOLAM 0.25 MG PO TABS
0.5000 mg | ORAL_TABLET | Freq: Three times a day (TID) | ORAL | Status: DC | PRN
  Administered 2012-12-14 (×2): 1 mg via ORAL
  Administered 2012-12-15 (×4): 0.5 mg via ORAL
  Administered 2012-12-16 – 2012-12-17 (×3): 1 mg via ORAL
  Administered 2012-12-17: 22:00:00 0.5 mg via ORAL
  Administered 2012-12-17 – 2012-12-18 (×2): 1 mg via ORAL
  Filled 2012-12-13 (×3): qty 4
  Filled 2012-12-13: qty 2
  Filled 2012-12-13 (×2): qty 4
  Filled 2012-12-13: qty 3
  Filled 2012-12-13: qty 4
  Filled 2012-12-13: qty 2
  Filled 2012-12-13: qty 1
  Filled 2012-12-13: qty 4
  Filled 2012-12-13 (×3): qty 1
  Filled 2012-12-13: qty 4

## 2012-12-13 MED ORDER — SIMVASTATIN 10 MG PO TABS
10.0000 mg | ORAL_TABLET | Freq: Every day | ORAL | Status: DC
  Administered 2012-12-14 – 2012-12-17 (×4): 10 mg via ORAL
  Filled 2012-12-13 (×6): qty 1

## 2012-12-13 MED ORDER — ASPIRIN 81 MG PO CHEW
CHEWABLE_TABLET | ORAL | Status: AC
  Filled 2012-12-13: qty 4

## 2012-12-13 MED ORDER — ASPIRIN EC 81 MG PO TBEC
81.0000 mg | DELAYED_RELEASE_TABLET | Freq: Every day | ORAL | Status: DC
  Administered 2012-12-14 – 2012-12-18 (×5): 81 mg via ORAL
  Filled 2012-12-13 (×5): qty 1

## 2012-12-13 MED ORDER — LORAZEPAM 2 MG/ML IJ SOLN
0.5000 mg | Freq: Once | INTRAMUSCULAR | Status: AC
  Administered 2012-12-13: 0.5 mg via INTRAVENOUS
  Filled 2012-12-13: qty 1

## 2012-12-13 MED ORDER — ASPIRIN 81 MG PO CHEW
324.0000 mg | CHEWABLE_TABLET | Freq: Once | ORAL | Status: AC
  Administered 2012-12-13: 324 mg via ORAL

## 2012-12-13 NOTE — ED Notes (Signed)
MD at bedside. 

## 2012-12-13 NOTE — ED Notes (Signed)
Pt states CP feels similar to last MI. Intermittent substernal CP that radiates left neck left shoulder down left arm with diaphoresis, Nausea. Pain relived by nothing, and exacerbated by nothing. Pt administered 325 mg adult ASA this morning at his usual daily time.

## 2012-12-13 NOTE — ED Provider Notes (Signed)
History     CSN: [id number]  Arrival date & time 12/13/12  2143   First MD Initiated Contact with Patient 12/13/12 2220      Chief Complaint  Patient presents with  . Chest Pain    (Consider location/radiation/quality/duration/timing/severity/associated sxs/prior treatment) HPI Pt with 2-3 days of R sided chest and back pain, worse with deep inspiration and coughing. +productive cough, sinus congestion and sore throat. +sick contacts. Pt also report episodic R jaw pain that feel similar to when he had an MI 3 years ago. No lower ext swelling or pain.  Past Medical History  Diagnosis Date  . Hypertension   . GERD (gastroesophageal reflux disease)   . Hiatal hernia   . Diverticulitis   . Hyperlipidemia   . History of endoscopy   . Abdominal pain   . Hiatal hernia   . Arthritis   . Generalized headaches   . Heart attack   . Dizziness - light-headed     Past Surgical History  Procedure Laterality Date  . Colon surgery      sigmoid colectomy for diverticulitis  . Upper endoscopy w/ esophageal manometry      Family History  Problem Relation Age of Onset  . Coronary artery disease    . Hypertension    . Diabetes type II      History  Substance Use Topics  . Smoking status: Current Every Day Smoker -- 0.50 packs/day  . Smokeless tobacco: Never Used  . Alcohol Use: No      Review of Systems  Constitutional: Positive for fever and chills.  HENT: Positive for congestion, sore throat and sinus pressure.   Respiratory: Positive for cough and shortness of breath. Negative for wheezing.   Cardiovascular: Positive for chest pain. Negative for palpitations and leg swelling.  Gastrointestinal: Negative for nausea, vomiting, abdominal pain and diarrhea.  Musculoskeletal: Negative for back pain.  Skin: Negative for rash and wound.  Neurological: Negative for dizziness, weakness, light-headedness, numbness and headaches.  All other systems reviewed and are  negative.    Allergies  Review of patient's allergies indicates no known allergies.  Home Medications   No current outpatient prescriptions on file.  BP 130/73  Pulse 86  Temp(Src) 98.3 F (36.8 C) (Oral)  Resp 16  Ht 5\' 11"  (1.803 m)  Wt 185 lb (83.915 kg)  BMI 25.81 kg/m2  SpO2 100%  Physical Exam  Nursing note and vitals reviewed. Constitutional: He is oriented to person, place, and time. He appears well-developed and well-nourished. No distress.  HENT:  Head: Normocephalic and atraumatic.  Mouth/Throat: Oropharynx is clear and moist. No oropharyngeal exudate.  Eyes: EOM are normal. Pupils are equal, round, and reactive to light.  Neck: Normal range of motion. Neck supple.  Cardiovascular: Regular rhythm.   Tachycardia   Pulmonary/Chest: Effort normal and breath sounds normal. No respiratory distress. He has no wheezes. He has no rales. He exhibits no tenderness.  Abdominal: Soft. Bowel sounds are normal. He exhibits no distension and no mass. There is no tenderness. There is no rebound and no guarding.  Musculoskeletal: Normal range of motion. He exhibits no edema and no tenderness.  No lower ext swelling or pain  Neurological: He is alert and oriented to person, place, and time.  Moves all ext without deficit  Skin: Skin is warm and dry. No rash noted. No erythema.  Psychiatric: He has a normal mood and affect. His behavior is normal.    ED Course  Procedures (including  critical care time)  Labs Reviewed  CBC - Abnormal; Notable for the following:    WBC 19.2 (*)    RBC 3.90 (*)    Hemoglobin 12.7 (*)    HCT 37.2 (*)    All other components within normal limits  BASIC METABOLIC PANEL - Abnormal; Notable for the following:    Glucose, Bld 112 (*)    All other components within normal limits  PRO B NATRIURETIC PEPTIDE - Abnormal; Notable for the following:    Pro B Natriuretic peptide (BNP) 1212.0 (*)    All other components within normal limits  CBC -  Abnormal; Notable for the following:    WBC 18.4 (*)    RBC 3.75 (*)    Hemoglobin 12.0 (*)    HCT 36.2 (*)    All other components within normal limits  BASIC METABOLIC PANEL - Abnormal; Notable for the following:    Glucose, Bld 122 (*)    All other components within normal limits  LIPID PANEL - Abnormal; Notable for the following:    Triglycerides 161 (*)    HDL 32 (*)    All other components within normal limits  HEPARIN LEVEL (UNFRACTIONATED) - Abnormal; Notable for the following:    Heparin Unfractionated <0.10 (*)    All other components within normal limits  HEPARIN LEVEL (UNFRACTIONATED) - Abnormal; Notable for the following:    Heparin Unfractionated <0.10 (*)    All other components within normal limits  CBC - Abnormal; Notable for the following:    WBC 18.2 (*)    RBC 3.75 (*)    Hemoglobin 12.1 (*)    HCT 35.8 (*)    All other components within normal limits  HEPARIN LEVEL (UNFRACTIONATED) - Abnormal; Notable for the following:    Heparin Unfractionated 0.23 (*)    All other components within normal limits  URINALYSIS, ROUTINE W REFLEX MICROSCOPIC - Abnormal; Notable for the following:    Glucose, UA >1000 (*)    All other components within normal limits  POCT I-STAT TROPONIN I - Abnormal; Notable for the following:    Troponin i, poc 20.72 (*)    All other components within normal limits  CULTURE, BLOOD (ROUTINE X 2)  CULTURE, BLOOD (ROUTINE X 2)  TSH  T4, FREE  MAGNESIUM  HEMOGLOBIN A1C  URINE MICROSCOPIC-ADD ON  HEPARIN LEVEL (UNFRACTIONATED)  TROPONIN I  SEDIMENTATION RATE  C-REACTIVE PROTEIN   Dg Chest 2 View  12/15/2012  *RADIOLOGY REPORT*  Clinical Data: Fever  CHEST - 2 VIEW  Comparison: Dec 13, 2012.  Findings: Cardiomediastinal silhouette appears normal.  No acute pulmonary disease is noted.  Bony thorax is intact.  IMPRESSION: No acute cardiopulmonary abnormality seen.   Original Report Authenticated By: Lupita Raider.,  M.D.    Dg Chest Port  1 View  12/13/2012  *RADIOLOGY REPORT*  Clinical Data: Chest pain and, hypertension.  PORTABLE CHEST - 1 VIEW  Comparison: 07/22/2012  Findings: Cardiomegaly.  No focal airspace opacities or effusions. No acute bony abnormality.  IMPRESSION: Cardiomegaly.  No acute findings.   Original Report Authenticated By: Charlett Nose, M.D.      1. Chest pain   2. CAD (coronary artery disease)   3. Hyperlipidemia   4. Acute myocardial infarction, subendocardial infarction, initial episode of care   5. Acute myopericarditis   6. Fever, unspecified      Date: 12/13/2012  Rate: 103  Rhythm: normal sinus rhythm  QRS Axis: normal  Intervals: normal  ST/T Wave abnormalities: normal  Conduction Disutrbances:none  Narrative Interpretation:   Old EKG Reviewed: unchanged    MDM   Elevated Trop. Discussed with Cardiology who will see in ED. Pt currently chest pain free       Loren Racer, MD 12/15/12 1528

## 2012-12-13 NOTE — ED Notes (Signed)
Cardiology at bedside.

## 2012-12-13 NOTE — H&P (Signed)
Brett Davis is an 47 y.o. male.    Chief Complaint: subjective fever and chest pain  HPI: 47 y/o male with a PMH of CAD and tobacco abuse presenting for chest pain evaluation.  He had a NSTEMI 2012 and underwent a cardiac cath 12/18/2010 that showed normal left main, 40-50% LAD, 40-50% circumflex artery stenosis and 99% RCA that was stented with a BMS; LVEF was 45% by ventriculogram.    Patient has been doing well until 2 weeks ago when he started to have upper respiratory type infection with subjective fever/chills, cough productive of clear and sometimes yellowish sputum and nasal congestion.  About 2 days prior to presentation he started to complain of intermittent chest pain which he describes as a tightness, 910 in severity, radiating to jaw and associated with shortness of breath, diaphoresis, nausea but no vomiting.   His chest pain last about 20 minutes before spontaneous relief.  In the ER, his EKG shows sinus tachycardia (heart rate 103 bpm), but there is no ST- or T-wave changes to suggest ischemia.  His initial cardiac marker shows troponin I or 20.7 and proBNP of 1212.  Currently, he is hemodynamically stable.  Past Medical History  Diagnosis Date  . Hypertension   . GERD (gastroesophageal reflux disease)   . Hiatal hernia   . Diverticulitis   . Hyperlipidemia   . History of endoscopy   . Abdominal pain   . Hiatal hernia   . Arthritis   . Generalized headaches   . Heart attack   . Dizziness - light-headed     Past Surgical History  Procedure Laterality Date  . Colon surgery      sigmoid colectomy for diverticulitis  . Upper endoscopy w/ esophageal manometry      Family History  Problem Relation Age of Onset  . Coronary artery disease    . Hypertension    . Diabetes type II     Social History:  reports that he has been smoking.  He has never used smokeless tobacco. He reports that he does not drink alcohol or use illicit drugs.  Allergies: No Known  Allergies   (Not in a hospital admission)  Results for orders placed during the hospital encounter of 12/13/12 (from the past 48 hour(s))  CBC     Status: Abnormal   Collection Time    12/13/12 10:00 PM      Result Value Range   WBC 19.2 (*) 4.0 - 10.5 K/uL   RBC 3.90 (*) 4.22 - 5.81 MIL/uL   Hemoglobin 12.7 (*) 13.0 - 17.0 g/dL   HCT 96.0 (*) 45.4 - 09.8 %   MCV 95.4  78.0 - 100.0 fL   MCH 32.6  26.0 - 34.0 pg   MCHC 34.1  30.0 - 36.0 g/dL   RDW 11.9  14.7 - 82.9 %   Platelets 262  150 - 400 K/uL  BASIC METABOLIC PANEL     Status: Abnormal   Collection Time    12/13/12 10:00 PM      Result Value Range   Sodium 138  135 - 145 mEq/L   Potassium 3.8  3.5 - 5.1 mEq/L   Chloride 102  96 - 112 mEq/L   CO2 28  19 - 32 mEq/L   Glucose, Bld 112 (*) 70 - 99 mg/dL   BUN 11  6 - 23 mg/dL   Creatinine, Ser 5.62  0.50 - 1.35 mg/dL   Calcium 9.0  8.4 - 13.0 mg/dL  GFR calc non Af Amer >90  >90 mL/min   GFR calc Af Amer >90  >90 mL/min   Comment:            The eGFR has been calculated     using the CKD EPI equation.     This calculation has not been     validated in all clinical     situations.     eGFR's persistently     <90 mL/min signify     possible Chronic Kidney Disease.  PRO B NATRIURETIC PEPTIDE     Status: Abnormal   Collection Time    12/13/12 10:00 PM      Result Value Range   Pro B Natriuretic peptide (BNP) 1212.0 (*) 0 - 125 pg/mL  POCT I-STAT TROPONIN I     Status: Abnormal   Collection Time    12/13/12 10:15 PM      Result Value Range   Troponin i, poc 20.72 (*) 0.00 - 0.08 ng/mL   Comment NOTIFIED PHYSICIAN     Comment 3            Comment: Due to the release kinetics of cTnI,     a negative result within the first hours     of the onset of symptoms does not rule out     myocardial infarction with certainty.     If myocardial infarction is still suspected,     repeat the test at appropriate intervals.   Dg Chest Port 1 View  12/13/2012  *RADIOLOGY  REPORT*  Clinical Data: Chest pain and, hypertension.  PORTABLE CHEST - 1 VIEW  Comparison: 07/22/2012  Findings: Cardiomegaly.  No focal airspace opacities or effusions. No acute bony abnormality.  IMPRESSION: Cardiomegaly.  No acute findings.   Original Report Authenticated By: Charlett Nose, M.D.     Review of Systems  Constitutional: Positive for fever, chills, malaise/fatigue and diaphoresis. Negative for weight loss.  HENT: Negative for hearing loss, ear pain, nosebleeds, congestion, sore throat, neck pain, tinnitus and ear discharge.   Eyes: Negative for blurred vision, double vision, photophobia, pain, discharge and redness.  Respiratory: Positive for shortness of breath. Negative for cough, hemoptysis, sputum production, wheezing and stridor.   Cardiovascular: Positive for chest pain. Negative for palpitations, orthopnea, claudication, leg swelling and PND.  Gastrointestinal: Positive for nausea. Negative for heartburn, vomiting, abdominal pain, diarrhea, constipation, blood in stool and melena.  Genitourinary: Negative for dysuria, urgency, frequency, hematuria and flank pain.  Musculoskeletal: Negative for myalgias, back pain and joint pain.  Skin: Negative for itching and rash.  Neurological: Negative for dizziness, tingling, tremors, seizures, weakness and headaches.  Psychiatric/Behavioral: Negative for hallucinations.    Blood pressure 146/84, pulse 94, temperature 100.9 F (38.3 C), temperature source Oral, resp. rate 19, SpO2 97.00%. Physical Exam  Constitutional: He is oriented to person, place, and time. He appears well-developed and well-nourished. No distress.  HENT:  Head: Normocephalic.  Eyes: Pupils are equal, round, and reactive to light. Right eye exhibits no discharge. Left eye exhibits no discharge. No scleral icterus.  Neck: Normal range of motion. Neck supple. No JVD present. No tracheal deviation present. No thyromegaly present.  Cardiovascular: Normal rate,  regular rhythm and normal heart sounds.  Exam reveals no gallop and no friction rub.   No murmur heard. Respiratory: Effort normal and breath sounds normal. No stridor. No respiratory distress. He has no wheezes. He has no rales. He exhibits no tenderness.  GI: Soft. Bowel sounds are  normal. He exhibits no distension. There is no tenderness. There is no rebound and no guarding.  Musculoskeletal: He exhibits no edema and no tenderness.  Neurological: He is alert and oriented to person, place, and time.  Skin: Skin is warm. No rash noted. He is not diaphoretic. No erythema.  Psychiatric: He has a normal mood and affect.     Assessment/Plan  1. NSTEMI: patient has NSTEMI with troponin-I or 20.7 and EKG showing sinus tachycardia with no ST- or T-wave changes to suggest ischemia.  However, given his recent upper respiratory-type symptoms with low-grade fever and chills, cannot rule out myocarditis, myositis or valvular vegetation with emboli.  We will admit the patient to telemetry, obtain serial cardiac markers and start him on Aspirin and Heparin drip.  We will first obtain a TTE in the morning to evaluation his LVEF and valves for vegetation.  If clinically indicated, we may consider TEE. We will perform a cardiac catheterization once we've ruled out active infection. We will treat his chest pain with prn nitrates and start him on low-dose beta-blockers and Statin   Gianne Shugars E 12/13/2012, 11:54 PM

## 2012-12-13 NOTE — ED Notes (Signed)
Pt states he has been sick over the past week, c/o productive cough, sob, neck pain, and tightness in chest. Pt c/o fevers and chills at home today. Denies vomiting, c/o mild nausea. Denies abd pain.

## 2012-12-14 ENCOUNTER — Encounter (HOSPITAL_COMMUNITY): Payer: Self-pay | Admitting: *Deleted

## 2012-12-14 LAB — CBC
HCT: 36.2 % — ABNORMAL LOW (ref 39.0–52.0)
Hemoglobin: 12 g/dL — ABNORMAL LOW (ref 13.0–17.0)
MCHC: 33.1 g/dL (ref 30.0–36.0)
RDW: 13.2 % (ref 11.5–15.5)
WBC: 18.4 10*3/uL — ABNORMAL HIGH (ref 4.0–10.5)

## 2012-12-14 LAB — HEMOGLOBIN A1C
Hgb A1c MFr Bld: 5.6 % (ref <5.7)
Mean Plasma Glucose: 114 mg/dL (ref <117)

## 2012-12-14 LAB — TSH: TSH: 0.807 u[IU]/mL (ref 0.350–4.500)

## 2012-12-14 LAB — LIPID PANEL
Cholesterol: 111 mg/dL (ref 0–200)
HDL: 32 mg/dL — ABNORMAL LOW (ref >39)
Total CHOL/HDL Ratio: 3.5 RATIO
Triglycerides: 161 mg/dL — ABNORMAL HIGH (ref <150)

## 2012-12-14 LAB — BASIC METABOLIC PANEL
Chloride: 106 mEq/L (ref 96–112)
GFR calc Af Amer: >90 mL/min (ref >90)
GFR calc non Af Amer: >90 mL/min (ref >90)
Potassium: 3.6 mEq/L (ref 3.5–5.1)
Sodium: 141 mEq/L (ref 135–145)

## 2012-12-14 LAB — MAGNESIUM: Magnesium: 2 mg/dL (ref 1.5–2.5)

## 2012-12-14 LAB — T4, FREE: Free T4: 0.91 ng/dL (ref 0.80–1.80)

## 2012-12-14 MED ORDER — HEPARIN BOLUS VIA INFUSION
3000.0000 [IU] | Freq: Once | INTRAVENOUS | Status: AC
  Administered 2012-12-14: 3000 [IU] via INTRAVENOUS
  Filled 2012-12-14: qty 3000

## 2012-12-14 MED ORDER — NICOTINE 14 MG/24HR TD PT24
14.0000 mg | MEDICATED_PATCH | Freq: Every day | TRANSDERMAL | Status: DC
  Administered 2012-12-14 – 2012-12-18 (×5): 14 mg via TRANSDERMAL
  Filled 2012-12-14 (×6): qty 1

## 2012-12-14 MED ORDER — HEPARIN BOLUS VIA INFUSION
4000.0000 [IU] | Freq: Once | INTRAVENOUS | Status: AC
  Administered 2012-12-14: 4000 [IU] via INTRAVENOUS

## 2012-12-14 MED ORDER — HEPARIN (PORCINE) IN NACL 100-0.45 UNIT/ML-% IJ SOLN
1500.0000 [IU]/h | INTRAMUSCULAR | Status: DC
  Administered 2012-12-14: 1000 [IU]/h via INTRAVENOUS
  Administered 2012-12-14: 1200 [IU]/h via INTRAVENOUS
  Filled 2012-12-14 (×3): qty 250

## 2012-12-14 NOTE — Progress Notes (Signed)
ANTICOAGULATION CONSULT NOTE - Follow Up Consult  Pharmacy Consult for Heparin Indication: chest pain/ACS  No Known Allergies  Labs:  Recent Labs  12/13/12 2200 12/14/12 0450 12/14/12 0900 12/14/12 1633  HGB 12.7* 12.0*  --   --   HCT 37.2* 36.2*  --   --   PLT 262 234  --   --   HEPARINUNFRC  --   --  <0.10* <0.10*  CREATININE 0.90 0.86  --   --     Estimated Creatinine Clearance: 114.3 ml/min (by C-G formula based on Cr of 0.86).   Assessment: 65 YOM presented with chest pain on IV heparin. No anticoagulation PTA, hgb 12.7, plt 262.  IHL still <0.1 units/ml.   Goal of Therapy:  Heparin level 0.3-0.7 units/ml Monitor platelets by anticoagulation protocol: Yes   Plan:  -Heparin bolus 3000 units - Increase heparin infusion 1500 units/hr - Heparin level 6 hrs after rate change - Daily heparin level and CBC  Okey Regal, PharmD [phone]   12/14/2012,5:16 PM

## 2012-12-14 NOTE — Progress Notes (Signed)
ANTICOAGULATION CONSULT NOTE - Initial Consult  Pharmacy Consult for Heparin Indication: chest pain/ACS  No Known Allergies  Patient Measurements: Height: 5\' 11"  (180.3 cm) Weight: 177 lb (80.287 kg) IBW/kg (Calculated) : 75.3 Heparin Dosing Weight: 80 kg  Vital Signs: Temp: 100.9 F (38.3 C) (05/11 2155) Temp src: Oral (05/11 2155) BP: 146/84 mmHg (05/11 2230) Pulse Rate: 94 (05/11 2230)  Labs:  Recent Labs  12/13/12 2200  HGB 12.7*  HCT 37.2*  PLT 262  CREATININE 0.90    Estimated Creatinine Clearance: 109.2 ml/min (by C-G formula based on Cr of 0.9).   Medical History: Past Medical History  Diagnosis Date  . Hypertension   . GERD (gastroesophageal reflux disease)   . Hiatal hernia   . Diverticulitis   . Hyperlipidemia   . History of endoscopy   . Abdominal pain   . Hiatal hernia   . Arthritis   . Generalized headaches   . Heart attack   . Dizziness - light-headed     Assessment: Brett Davis presented with chest pain, to start IV heparin. No anticoagulation PTA, hgb 12.7, plt 262.   Goal of Therapy:  Heparin level 0.3-0.7 units/ml Monitor platelets by anticoagulation protocol: Yes   Plan:  - Heparin bolus 4000 units/hr - Heparin infusion 1000 units/hr - Heparin level 6 hrs after infusion start - Daily heparin level and CBC  Bayard Hugger, PharmD, BCPS  Clinical Pharmacist  Pager: [phone]   12/14/2012,12:02 AM

## 2012-12-14 NOTE — Progress Notes (Signed)
ANTICOAGULATION CONSULT NOTE - Follow Up Consult  Pharmacy Consult for Heparin Indication: chest pain/ACS  No Known Allergies  Patient Measurements: Height: 5\' 11"  (180.3 cm) Weight: 185 lb (83.915 kg) IBW/kg (Calculated) : 75.3 Heparin Dosing Weight: 80 kg  Vital Signs: Temp: 99.7 F (37.6 C) (05/12 0813) Temp src: Oral (05/12 0408) BP: 137/74 mmHg (05/12 0408) Pulse Rate: 80 (05/12 0408)  Labs:  Recent Labs  12/13/12 2200 12/14/12 0450 12/14/12 0900  HGB 12.7* 12.0*  --   HCT 37.2* 36.2*  --   PLT 262 234  --   HEPARINUNFRC  --   --  <0.10*  CREATININE 0.90 0.86  --     Estimated Creatinine Clearance: 114.3 ml/min (by C-G formula based on Cr of 0.86).   Medical History: Past Medical History  Diagnosis Date  . Hypertension   . GERD (gastroesophageal reflux disease)   . Hiatal hernia   . Diverticulitis   . Hyperlipidemia   . History of endoscopy   . Abdominal pain   . Hiatal hernia   . Arthritis   . Generalized headaches   . Heart attack   . Dizziness - light-headed     Assessment: 4 YOM presented with chest pain on IV heparin. No anticoagulation PTA, hgb 12.7, plt 262.  Initial HL <0.1 units/ml.   Goal of Therapy:  Heparin level 0.3-0.7 units/ml Monitor platelets by anticoagulation protocol: Yes   Plan:  -Heparin bolus 3000 units - Increase heparin infusion 1200 units/hr - Heparin level 6 hrs after rate change - Daily heparin level and CBC  Talbert Cage, PharmD Clinical Pharmacist  Pager: [phone]   12/14/2012,10:26 AM

## 2012-12-14 NOTE — Progress Notes (Signed)
Utilization Review Completed.Brett Davis T5/07/2013

## 2012-12-15 ENCOUNTER — Inpatient Hospital Stay (HOSPITAL_COMMUNITY): Payer: BC Managed Care – PPO

## 2012-12-15 DIAGNOSIS — I309 Acute pericarditis, unspecified: Secondary | ICD-10-CM

## 2012-12-15 DIAGNOSIS — R509 Fever, unspecified: Secondary | ICD-10-CM

## 2012-12-15 LAB — SEDIMENTATION RATE: Sed Rate: 42 mm/hr — ABNORMAL HIGH (ref 0–16)

## 2012-12-15 LAB — CBC
HCT: 35.8 % — ABNORMAL LOW (ref 39.0–52.0)
Hemoglobin: 12.1 g/dL — ABNORMAL LOW (ref 13.0–17.0)
MCH: 32.3 pg (ref 26.0–34.0)
MCHC: 33.8 g/dL (ref 30.0–36.0)
MCV: 95.5 fL (ref 78.0–100.0)
RBC: 3.75 MIL/uL — ABNORMAL LOW (ref 4.22–5.81)

## 2012-12-15 LAB — URINALYSIS, ROUTINE W REFLEX MICROSCOPIC
Bilirubin Urine: NEGATIVE
Glucose, UA: >1000 mg/dL — AB
Nitrite: NEGATIVE
Specific Gravity, Urine: 1.021 (ref 1.005–1.030)
pH: 6 (ref 5.0–8.0)

## 2012-12-15 LAB — URINE MICROSCOPIC-ADD ON

## 2012-12-15 LAB — HEPARIN LEVEL (UNFRACTIONATED)
Heparin Unfractionated: 0.23 IU/mL — ABNORMAL LOW (ref 0.30–0.70)
Heparin Unfractionated: 0.16 IU/mL — ABNORMAL LOW (ref 0.30–0.70)

## 2012-12-15 MED ORDER — IBUPROFEN 800 MG PO TABS
800.0000 mg | ORAL_TABLET | Freq: Three times a day (TID) | ORAL | Status: DC
  Administered 2012-12-15 – 2012-12-17 (×6): 800 mg via ORAL
  Filled 2012-12-15 (×13): qty 1

## 2012-12-15 MED ORDER — HEPARIN (PORCINE) IN NACL 100-0.45 UNIT/ML-% IJ SOLN
1850.0000 [IU]/h | INTRAMUSCULAR | Status: DC
  Administered 2012-12-15: 1650 [IU]/h via INTRAVENOUS
  Administered 2012-12-15 (×2): 1850 [IU]/h via INTRAVENOUS
  Administered 2012-12-15: 1650 [IU]/h via INTRAVENOUS
  Filled 2012-12-15 (×2): qty 250

## 2012-12-15 MED ORDER — PANTOPRAZOLE SODIUM 40 MG PO TBEC
40.0000 mg | DELAYED_RELEASE_TABLET | Freq: Every day | ORAL | Status: DC
  Administered 2012-12-15 – 2012-12-18 (×4): 40 mg via ORAL
  Filled 2012-12-15 (×4): qty 1

## 2012-12-15 NOTE — Progress Notes (Signed)
Subjective:  Chest pain has improved. He did have fever, night sweats, drenches sheets he states. Over the last 2-3 weeks he has been feeling poor, cough, upper respiratory-like infection. His wife started off with an infection. He smokes. Troponin initially was 20. Repeat pending. EKG does not show any evidence of ST segment elevation or other signs of ischemia. He is currently not short of breath. No leg edema. His white count is elevated at 18.  Objective:  Vital Signs in the last 24 hours: Temp:  [98.4 F (36.9 C)-101.1 F (38.4 C)] 98.5 F (36.9 C) (05/13 0342) Pulse Rate:  [75-92] 75 (05/13 0342) Resp:  [18-20] 18 (05/13 0342) BP: (127-150)/(72-96) 146/96 mmHg (05/13 0342) SpO2:  [98 %-100 %] 100 % (05/13 0342)  Intake/Output from previous day: 05/12 0701 - 05/13 0700 In: 1113.3 [P.O.:840; I.V.:273.3] Out: -    Physical Exam: General: Well developed, well nourished, in no acute distress. Mildly ill-appearing Head:  Normocephalic and atraumatic. Lungs: Bilateral inspiratory crackles Heart: Normal S1 and S2.  No murmur, no rubs or gallops.  Abdomen: soft, non-tender, positive bowel sounds. Extremities: No clubbing or cyanosis. No edema. Tatoo Neurologic: Alert and oriented x 3.    Lab Results:  Recent Labs  12/14/12 0450 12/15/12 0535  WBC 18.4* 18.2*  HGB 12.0* 12.1*  PLT 234 225    Recent Labs  12/13/12 2200 12/14/12 0450  NA 138 141  K 3.8 3.6  CL 102 106  CO2 28 28  GLUCOSE 112* 122*  BUN 11 12  CREATININE 0.90 0.86    Recent Labs  12/14/12 0450  CHOL 111  Imaging: Dg Chest Port 1 View  12/13/2012  *RADIOLOGY REPORT*  Clinical Data: Chest pain and, hypertension.  PORTABLE CHEST - 1 VIEW  Comparison: 07/22/2012  Findings: Cardiomegaly.  No focal airspace opacities or effusions. No acute bony abnormality.  IMPRESSION: Cardiomegaly.  No acute findings.   Original Report Authenticated By: Charlett Nose, M.D.    Personally viewed.   Telemetry: No  adverse arrhythmias Personally viewed.   EKG:  No ST elevation. Normal sinus rhythm  Cardiac Studies:  Echocardiogram, normal EF, no wall motion abnormality, no pericardial effusion  Assessment/Plan:   47 year old male smoker with coronary artery disease status post RCA stent admitted with subjective fevers, sweats, upper respiratory like infection, normal appearing EKG, normal appearing x-ray, normal EF, no pericardial effusion with initial troponin of 20. Smoker.  1. Elevated troponin/non-ST elevation myocardial infarction-I have reviewed admitting physician's note and I agree that there is a strong possibility of myopericarditis present. His EKG does not demonstrate any ST segment elevation however. Echocardiogram reassuring. Repeat troponin pending. If decreased, I will likely discontinue heparin for he has been on now greater than 24 hours. This will also reduce risk of hemorrhagic transformation if he doesn't fact have pericarditis.  This morning he does have fever of 101, white count remains elevated, x-ray personally reviewed does not demonstrate any evidence of pneumonia although he does have crackles in his lungs bilaterally. I have asked hospitalist team to evaluate. Appreciate their assistance. Blood cultures have been ordered. Certainly there is also a possibility of endocarditis given his persistent infection-like state, fevers, sweats.  There is still a possibility of coronary artery disease as the ultimate cause of his symptoms however he also has subjective fevers, objective fevers, night sweats which concern me more for an inflammatory process such as myopericarditis which could occur potentially after an acute coronary event. If blood cultures are  negative and he remains afebrile, he would not be unreasonable to perform angiography to further assess. For now, reassuring ejection fraction.  I will give him ibuprofen 800 mg 3 times a day.  2. Tobacco cessation discussed  3.  Coronary artery disease-as described above.  4. Leukocytosis/fever-appreciate tried hospitalist workup, assistance. May in fact require transesophageal echocardiogram if blood cultures are positive.  Complex medical decision-making. Potentially life-threatening illness.      Shariece Viveiros 12/15/2012, 9:10 AM

## 2012-12-15 NOTE — Progress Notes (Signed)
ANTICOAGULATION CONSULT NOTE - Follow Up Consult  Pharmacy Consult for Heparin Indication: chest pain/ACS  No Known Allergies  Patient Measurements: Height: 5\' 11"  (180.3 cm) Weight: 185 lb (83.915 kg) IBW/kg (Calculated) : 75.3 Heparin Dosing Weight: 80 kg  Vital Signs: Temp: 98.5 F (36.9 C) (05/13 1959) Temp src: Oral (05/13 1959) BP: 135/77 mmHg (05/13 1959) Pulse Rate: 82 (05/13 1959)  Labs:  Recent Labs  12/13/12 2200 12/14/12 0450  12/14/12 1633 12/15/12 0045 12/15/12 0535 12/15/12 1830 12/15/12 1850  HGB 12.7* 12.0*  --   --   --  12.1*  --   --   HCT 37.2* 36.2*  --   --   --  35.8*  --   --   PLT 262 234  --   --   --  225  --   --   HEPARINUNFRC  --   --   < > <0.10* 0.23*  --   --  0.16*  CREATININE 0.90 0.86  --   --   --   --   --   --   TROPONINI  --   --   --   --   --   --  7.24*  --   < > = values in this interval not displayed.  Estimated Creatinine Clearance: 114.3 ml/min (by C-G formula based on Cr of 0.86).   Medical History: Past Medical History  Diagnosis Date  . Hypertension   . GERD (gastroesophageal reflux disease)   . Hiatal hernia   . Diverticulitis   . Hyperlipidemia   . History of endoscopy   . Abdominal pain   . Hiatal hernia   . Arthritis   . Generalized headaches   . Heart attack   . Dizziness - light-headed     Assessment: 13 YOM presented with chest pain on IV heparin.  Lab issues today and heparin level has been unavailable until this evening.  Repeat level is 0.16 on IV heparin rate of 1650 units/hr despite a big increase.  No noted bleeding complications.    Goal of Therapy:  Heparin level 0.3-0.7 units/ml Monitor platelets by anticoagulation protocol: Yes   Plan:  - Will increase rate to 1850 units/hr. - Heparin level in AM after rate change - Daily heparin level and CBC   Nadara Mustard, PharmD., MS Clinical Pharmacist Pager:  [phone] Thank you for allowing pharmacy to be part of this patients  care team.  12/15/2012,8:14 PM

## 2012-12-15 NOTE — Progress Notes (Signed)
ANTICOAGULATION CONSULT NOTE - Follow Up Consult    HL = 0.23 (goal 0.3 - 0.7 units/mL) Heparin dosing weight = 80 kg   Assessment: 46 YOM continues on IV heparin for chest pain/ACS.  Heparin level remains sub-therapeutic but is trending up toward goal.  No bleeding reported.   Plan: - Increase heparin gtt to 1650 units/hr - Check 6 hr HL     Colbin Jovel D. Laney Potash, PharmD, BCPS Pager:  [phone] 12/15/2012, 1:39 AM

## 2012-12-15 NOTE — Consult Note (Signed)
[facility] Medical Consultation  Brett Davis DETAILS Name: Brett Davis Age: 47 y.o. Sex: male Date of Birth: 14-Dec-1965 Admit Date: 12/13/2012 ZOX:[id number], MD Requesting MD: Donato Schultz, MD  Date of consultation: 12/15/12  REASON FOR CONSULTATION:  Fever  Impression 1.Fever:  Brett Davis has had subjective fever for the past one week-3 weeks now he has had nasal congestion, and cough. On admission he had chest pain with significant troponin elevation  - Certainly viral myopericarditis is in the differential  - Since, no evidence of pneumonia UTI, will need to get blood cultures to make sure he does not have endocarditis. 2. Elevated troponin's: At this time it is not sure whether,from non-STEMI or myopericarditis 3. History of psoriatic arthritis: Apparently Brett Davis is on weekly injectable biologic agent  Recommendations 1. Repeat chest x-ray, check a UA and get blood cultures 2. Since this has been going on for 2-3 weeks-see no urgent who immediate indication for antibiotics. We can start antibiotics if the cultures are positive or if the Brett Davis develops persistent fever-or hemodynamic instability. Will monitor off antibiotics for now. 3. We'll check ESR and CRP 4. Consider infectious disease evaluation, if continues to have fever or if  culture data is negative. 4 Rest of the issues the primary team.  DVT Prophylaxis: - Not needed as on heparin infusion  Code Status: - Full code  [facility] will followup again tomorrow. Please contact me if I can be of assistance in the meanwhile. Thank you for this consultation.  [facility] Pager [phone]  HPI: Brett Davis is a 47 year old white male with a history of coronary artery disease status post PCI in 2012, history of psoriatic arthritis on presumed injectable biologic agents, recent history of diverticulitis completed antibiotics approximately 3-4 weeks prior to  this admission who presented to the hospital on 5/11 for evaluation of chest pain radiating to his neck. The Brett Davis claims that for the past 2-3 weeks he has had a upper respiratory tract infection associated with fever, cough with clear/yellow sputum and nasal congestion. He did have intermittent right sided pleuritic chest pain. Approximately 1-2 days prior to admission, Brett Davis started developing chest pain-in the retrosternal area, on the day of admission he started having worsening retrosternal chest pain that radiated to his jaw. He then proceeded to come to the emergency room, where the EKG did not show acute changes but did have an initial point of care troponin that was significantly elevated. He was admitted by the cardiology service, because of his persistent fever and elevated leukocytosis, I was consulted for further assistance in regarding this matter. Brett Davis claims that for the past few weeks he has had associated fever, with drenching sweats. Fever has been associated with chills and right is as well.  ALLERGIES:  No Known Allergies  PAST MEDICAL HISTORY: Past Medical History  Diagnosis Date  . Hypertension   . GERD (gastroesophageal reflux disease)   . Hiatal hernia   . Diverticulitis   . Hyperlipidemia   . History of endoscopy   . Abdominal pain   . Hiatal hernia   . Arthritis   . Generalized headaches   . Heart attack   . Dizziness - light-headed     PAST SURGICAL HISTORY: Past Surgical History  Procedure Laterality Date  . Colon surgery      sigmoid colectomy for diverticulitis  . Upper endoscopy w/ esophageal manometry      MEDICATIONS AT HOME: Prior to Admission medications  Medication Sig Start Date End Date Taking? Authorizing Provider  ALPRAZolam Prudy Feeler) 1 MG tablet Take 0.5-1 mg by mouth 3 (three) times daily as needed for anxiety. For anxiety   Yes Historical Provider, MD  aspirin 325 MG tablet Take 325 mg by mouth every morning.    Yes Historical  Provider, MD  desvenlafaxine (PRISTIQ) 100 MG 24 hr tablet Take 100 mg by mouth daily.   Yes Historical Provider, MD  HYDROcodone-acetaminophen (VICODIN) 5-500 MG per tablet Take 1 tablet by mouth every 6 (six) hours as needed. For pain   Yes Historical Provider, MD  ibuprofen (ADVIL) 200 MG tablet Take 400-600 mg by mouth daily as needed for pain. For pain   Yes Historical Provider, MD  methotrexate 25 MG/ML SOLN Inject 25 mg as directed every 7 (seven) days. every Sat for arthritis   Yes Historical Provider, MD  nebivolol (BYSTOLIC) 5 MG tablet Take 5 mg by mouth every morning.    Yes Historical Provider, MD  NITROSTAT 0.4 MG SL tablet Place 0.4 mg under the tongue every 5 (five) minutes as needed. For chest pain 03/13/11  Yes Historical Provider, MD  simvastatin (ZOCOR) 10 MG tablet Take 10 mg by mouth at bedtime.     Yes Historical Provider, MD    FAMILY HISTORY: Family History  Problem Relation Age of Onset  . Coronary artery disease    . Hypertension    . Diabetes type II      SOCIAL HISTORY:  reports that he has been smoking.  He has never used smokeless tobacco. He reports that he does not drink alcohol or use illicit drugs.  REVIEW OF SYSTEMS:  Constitutional:   No  weight loss, night sweats, fatigue.  HEENT:    No headaches, Difficulty swallowing,Tooth/dental problems,Sore throat,  No sneezing, itching, ear ache, nasal congestion, post nasal drip,   Cardio-vascular: No Orthopnea, PND, swelling in lower extremities, anasarca, dizziness, palpitations  GI:  No heartburn, indigestion, abdominal pain, nausea, vomiting, diarrhea, change in  bowel habits, loss of appetite  Resp:  No coughing up of blood.No change in color of mucus.No wheezing.No chest wall deformity  Skin:  no rash or lesions.  GU:  no dysuria, change in color of urine, no urgency or frequency.  No flank pain.  Musculoskeletal: No joint pain or swelling.  No decreased range of motion.  No back  pain.  Psych: No change in mood or affect. No depression or anxiety.  No memory loss.   PHYSICAL EXAM: Blood pressure 146/96, pulse 75, temperature 98.5 F (36.9 C), temperature source Oral, resp. rate 18, height 5\' 11"  (1.803 m), weight 83.915 kg (185 lb), SpO2 100.00%.  General appearance :Awake, alert, not in any distress. Speech Clear. Not toxic Looking HEENT: Atraumatic and Normocephalic, pupils equally reactive to light and accomodation Neck: supple, no JVD. No cervical lymphadenopathy.  Chest:Good air entry bilaterally, no added sounds  CVS: S1 S2 regular, no murmurs.  Abdomen: Bowel sounds present, Non tender and not distended with no gaurding, rigidity or rebound. Extremities: B/L Lower Ext shows no edema, both legs are warm to touch, with  dorsalis pedis pulses palpable. Neurology: Awake alert, and oriented X 3, CN II-XII intact, Non focal, Deep Tendon Reflex-2+ all over, plantar's downgoing B/L, sensory exam is grossly intact.  Skin:No Rash Wounds:N/A  LABS ON ADMISSION:   Recent Labs  12/13/12 2200 12/13/12 2354 12/14/12 0450  NA 138  --  141  K 3.8  --  3.6  CL  102  --  106  CO2 28  --  28  GLUCOSE 112*  --  122*  BUN 11  --  12  CREATININE 0.90  --  0.86  CALCIUM 9.0  --  8.5  MG  --  2.0  --    No results found for this basename: AST, ALT, ALKPHOS, BILITOT, PROT, ALBUMIN,  in the last 72 hours No results found for this basename: LIPASE, AMYLASE,  in the last 72 hours  Recent Labs  12/14/12 0450 12/15/12 0535  WBC 18.4* 18.2*  HGB 12.0* 12.1*  HCT 36.2* 35.8*  MCV 96.5 95.5  PLT 234 225   No results found for this basename: CKTOTAL, CKMB, CKMBINDEX, TROPONINI,  in the last 72 hours No results found for this basename: DDIMER,  in the last 72 hours No components found with this basename: POCBNP,    RADIOLOGIC STUDIES ON ADMISSION: Dg Chest 2 View  12/15/2012  *RADIOLOGY REPORT*  Clinical Data: Fever  CHEST - 2 VIEW  Comparison: Dec 13, 2012.   Findings: Cardiomediastinal silhouette appears normal.  No acute pulmonary disease is noted.  Bony thorax is intact.  IMPRESSION: No acute cardiopulmonary abnormality seen.   Original Report Authenticated By: Lupita Raider.,  M.D.    Ct Abdomen Pelvis W Contrast  11/20/2012  *RADIOLOGY REPORT*  Clinical Data: Left lower quadrant pain.  Nausea diarrhea.  History of diverticulitis with colon resection.  CT ABDOMEN AND PELVIS WITH CONTRAST  Technique:  Multidetector CT imaging of the abdomen and pelvis was performed following the standard protocol during bolus administration of intravenous contrast.  Contrast: OMNIPAQUE IOHEXOL 300 MG/ML  SOLN  Comparison: [facility] at [address] CT abdomen and pelvis exam from 12/28/2008  Findings: Lungs are clear bilaterally.  No focal abnormalities seen in the liver or spleen.  Moderate hiatal hernia is noted.  Surgical clips are seen in the region of the esophagogastric junction and the Brett Davis is status post hiatal hernia repair.  The duodenum, pancreas, and adrenal glands are unremarkable.  Gallbladder is decompressed.  Right kidney is unremarkable.  2 mm nonobstructing stone is identified in the interpolar left kidney.  No abdominal aortic aneurysm.  No free fluid or lymphadenopathy in the abdomen.  Imaging through the pelvis shows no free intraperitoneal fluid. There is no pelvic sidewall lymphadenopathy.  Bladder is unremarkable.  Diverticular changes are seen in the left colon and there is some edema/inflammation adjacent to the mid descending segment.  No extraluminal gas or pericolonic abscess at this time. The terminal ileum is normal.  The appendix is normal.  Bone windows reveal no worrisome lytic or sclerotic osseous lesions.  IMPRESSION: Wall thickening with pericolonic edema/inflammation of the mid descending colon is associated with diverticular change.  Imaging features are most suggestive of diverticulitis without perforation or abscess.  As  colonic neoplasm can present with similar imaging features, close follow-up is suggested.  Moderate recurrent hiatal hernia.   Original Report Authenticated By: Kennith Center, M.D.    Dg Chest Port 1 View  12/13/2012  *RADIOLOGY REPORT*  Clinical Data: Chest pain and, hypertension.  PORTABLE CHEST - 1 VIEW  Comparison: 07/22/2012  Findings: Cardiomegaly.  No focal airspace opacities or effusions. No acute bony abnormality.  IMPRESSION: Cardiomegaly.  No acute findings.   Original Report Authenticated By: Charlett Nose, M.D.      Total time spent 45 minutes.  [facility] Pager [phone]  If 7PM-7AM, please contact night-coverage [email or web address] Password [email or web address]  12/15/2012, 12:10 PM

## 2012-12-15 NOTE — Progress Notes (Signed)
CRITICAL VALUE ALERT  Critical value received:  Troponin 7.24  Date of notification:  12/15/2012  Time of notification:  20:15  Critical value read back:yes  Nurse who received alert:  Reggie, RN  MD notified (1st page): Dr. Tresa Endo  Time of first page:  20:15  MD notified (2nd page):Dr. [place] Callas  Time of second page:20:33  Responding MD:  Dr. [employer] Callas  Time MD responded:  20:35

## 2012-12-16 ENCOUNTER — Inpatient Hospital Stay (HOSPITAL_COMMUNITY): Payer: BC Managed Care – PPO

## 2012-12-16 DIAGNOSIS — K5732 Diverticulitis of large intestine without perforation or abscess without bleeding: Secondary | ICD-10-CM

## 2012-12-16 DIAGNOSIS — R079 Chest pain, unspecified: Secondary | ICD-10-CM

## 2012-12-16 DIAGNOSIS — I214 Non-ST elevation (NSTEMI) myocardial infarction: Principal | ICD-10-CM

## 2012-12-16 LAB — CBC
HCT: 35.9 % — ABNORMAL LOW (ref 39.0–52.0)
MCHC: 35.1 g/dL (ref 30.0–36.0)
Platelets: 188 10*3/uL (ref 150–400)
RDW: 13.3 % (ref 11.5–15.5)
WBC: 13.8 10*3/uL — ABNORMAL HIGH (ref 4.0–10.5)

## 2012-12-16 LAB — HEPARIN LEVEL (UNFRACTIONATED)
Heparin Unfractionated: 0.23 IU/mL — ABNORMAL LOW (ref 0.30–0.70)
Heparin Unfractionated: 0.24 IU/mL — ABNORMAL LOW (ref 0.30–0.70)

## 2012-12-16 LAB — COMPREHENSIVE METABOLIC PANEL
ALT: 12 U/L (ref 0–53)
ALT: 13 U/L (ref 0–53)
AST: 17 U/L (ref 0–37)
Albumin: 2.7 g/dL — ABNORMAL LOW (ref 3.5–5.2)
Alkaline Phosphatase: 43 U/L (ref 39–117)
CO2: 26 mEq/L (ref 19–32)
Calcium: 8.5 mg/dL (ref 8.4–10.5)
Chloride: 105 mEq/L (ref 96–112)
Creatinine, Ser: 0.92 mg/dL (ref 0.50–1.35)
GFR calc Af Amer: >90 mL/min (ref >90)
GFR calc Af Amer: >90 mL/min (ref >90)
GFR calc non Af Amer: >90 mL/min (ref >90)
Glucose, Bld: 94 mg/dL (ref 70–99)
Glucose, Bld: 90 mg/dL (ref 70–99)
Potassium: 4.4 mEq/L (ref 3.5–5.1)
Sodium: 138 mEq/L (ref 135–145)
Total Bilirubin: 0.2 mg/dL — ABNORMAL LOW (ref 0.3–1.2)
Total Protein: 6.1 g/dL (ref 6.0–8.3)

## 2012-12-16 LAB — CBC WITH DIFFERENTIAL/PLATELET
Basophils Relative: 0 % (ref 0–1)
Eosinophils Absolute: 0.3 10*3/uL (ref 0.0–0.7)
MCH: 32.4 pg (ref 26.0–34.0)
MCHC: 33.9 g/dL (ref 30.0–36.0)
Neutro Abs: 8.2 10*3/uL — ABNORMAL HIGH (ref 1.7–7.7)
Neutrophils Relative %: 66 % (ref 43–77)
Platelets: 241 10*3/uL (ref 150–400)
RBC: 3.64 MIL/uL — ABNORMAL LOW (ref 4.22–5.81)

## 2012-12-16 LAB — TROPONIN I: Troponin I: 4.36 ng/mL (ref <0.30)

## 2012-12-16 LAB — C-REACTIVE PROTEIN: CRP: 17.4 mg/dL — ABNORMAL HIGH (ref <0.60)

## 2012-12-16 MED ORDER — SODIUM CHLORIDE 0.9 % IJ SOLN: 3.0000 mL | INTRAMUSCULAR | Status: DC | PRN

## 2012-12-16 MED ORDER — SODIUM CHLORIDE 0.9 % IV SOLN
INTRAVENOUS | Status: DC
  Administered 2012-12-16: 21:00:00 via INTRAVENOUS

## 2012-12-16 MED ORDER — DIAZEPAM 5 MG PO TABS
5.0000 mg | ORAL_TABLET | ORAL | Status: AC
  Administered 2012-12-17: 5 mg via ORAL
  Filled 2012-12-16: qty 1

## 2012-12-16 MED ORDER — SODIUM CHLORIDE 0.9 % IJ SOLN: 3.0000 mL | Freq: Two times a day (BID) | INTRAMUSCULAR | Status: DC

## 2012-12-16 MED ORDER — IOHEXOL 300 MG/ML  SOLN
25.0000 mL | INTRAMUSCULAR | Status: AC
  Administered 2012-12-16: 25 mL via ORAL

## 2012-12-16 MED ORDER — HEPARIN (PORCINE) IN NACL 100-0.45 UNIT/ML-% IJ SOLN
2500.0000 [IU]/h | INTRAMUSCULAR | Status: DC
  Administered 2012-12-16: 2250 [IU]/h via INTRAVENOUS
  Administered 2012-12-16: 2000 [IU]/h via INTRAVENOUS
  Administered 2012-12-17: 2250 [IU]/h via INTRAVENOUS
  Filled 2012-12-16 (×2): qty 250

## 2012-12-16 MED ORDER — ASPIRIN 81 MG PO CHEW
324.0000 mg | CHEWABLE_TABLET | ORAL | Status: AC
  Administered 2012-12-17: 324 mg via ORAL
  Filled 2012-12-16: qty 4

## 2012-12-16 MED ORDER — IOHEXOL 300 MG/ML  SOLN
100.0000 mL | Freq: Once | INTRAMUSCULAR | Status: AC | PRN
  Administered 2012-12-16: 100 mL via INTRAVENOUS

## 2012-12-16 MED ORDER — SODIUM CHLORIDE 0.9 % IV SOLN: 250.0000 mL | INTRAVENOUS | Status: DC | PRN

## 2012-12-16 NOTE — Progress Notes (Signed)
ANTICOAGULATION CONSULT NOTE - Follow Up Consult  Pharmacy Consult:  Heparin Indication: chest pain/ACS  No Known Allergies  Patient Measurements: Height: 5\' 11"  (180.3 cm) Weight: 185 lb (83.915 kg) IBW/kg (Calculated) : 75.3 Heparin Dosing Weight: 80 kg  Vital Signs: Temp: 97.5 F (36.4 C) (05/14 0349) Temp src: Oral (05/14 0349) BP: 132/88 mmHg (05/14 0349) Pulse Rate: 62 (05/14 0349)  Labs:  Recent Labs  12/13/12 2200 12/14/12 0450  12/15/12 0045 12/15/12 0535 12/15/12 1830 12/15/12 1850 12/16/12 0540  HGB 12.7* 12.0*  --   --  12.1*  --   --   --   HCT 37.2* 36.2*  --   --  35.8*  --   --   --   PLT 262 234  --   --  225  --   --   --   HEPARINUNFRC  --   --   < > 0.23*  --   --  0.16* 0.23*  CREATININE 0.90 0.86  --   --   --   --   --   --   TROPONINI  --   --   --   --   --  7.24*  --   --   < > = values in this interval not displayed.  Estimated Creatinine Clearance: 114.3 ml/min (by C-G formula based on Cr of 0.86).     Assessment: 57 YOM admitted with chest pain to continue on IV heparin.  Heparin level remains sub-therapeutic but is increasing again.  RN reported IV site is removed completely (heparin stopped approximately 0745), but it was after the heparin level was obtained.  No issue with infusion before then.  No bleeding either.  Noted positive troponin.   Goal of Therapy:  Heparin level 0.3-0.7 units/ml Monitor platelets by anticoagulation protocol: Yes    Plan:  - Increase heparin gtt to 2000 units/hr once IV is reestablished - Check 6 hr HL     Allison Deshotels D. Laney Potash, PharmD, BCPS Pager:  [phone] 12/16/2012, 6:52 AM

## 2012-12-16 NOTE — Progress Notes (Signed)
Subjective:  No further chest discomfort however he did have night sweats once again. He remained afebrile. Blood cultures have been drawn. So far, no growth.  Objective:  Vital Signs in the last 24 hours: Temp:  [97.5 F (36.4 C)-98.5 F (36.9 C)] 97.5 F (36.4 C) (05/14 0349) Pulse Rate:  [62-86] 62 (05/14 0349) Resp:  [16-18] 18 (05/14 0349) BP: (130-135)/(73-88) 132/88 mmHg (05/14 0349) SpO2:  [99 %-100 %] 100 % (05/14 0349)  Intake/Output from previous day: 05/13 0701 - 05/14 0700 In: 720 [P.O.:720] Out: -    Physical Exam: General: Well developed, well nourished, in no acute distress. Mildly ill-appearing  Head: Normocephalic and atraumatic.  Lungs: Bilateral inspiratory crackles  Heart: Normal S1 and S2. No murmur, no rubs or gallops.  Abdomen: soft, non-tender, positive bowel sounds.  Extremities: No clubbing or cyanosis. No edema. [place]  Neurologic: Alert and oriented x 3.     Lab Results:  Recent Labs  12/15/12 0535 12/16/12 0540  WBC 18.2* 13.8*  HGB 12.1* 12.6*  PLT 225 188    Recent Labs  12/14/12 0450 12/16/12 0540  NA 141 138  K 3.6 4.4  CL 106 106  CO2 28 21  GLUCOSE 122* 94  BUN 12 9  CREATININE 0.86 0.71    Recent Labs  12/15/12 1830  TROPONINI 7.24*   Hepatic Function Panel  Recent Labs  12/16/12 0540  PROT 6.1  ALBUMIN 2.7*  AST 17  ALT 12  ALKPHOS 43  BILITOT 0.3    Recent Labs  12/14/12 0450  CHOL 111   No results found for this basename: PROTIME,  in the last 72 hours  Imaging: Dg Chest 2 View  12/15/2012   *RADIOLOGY REPORT*  Clinical Data: Fever  CHEST - 2 VIEW  Comparison: Dec 13, 2012.  Findings: Cardiomediastinal silhouette appears normal.  No acute pulmonary disease is noted.  Bony thorax is intact.  IMPRESSION: No acute cardiopulmonary abnormality seen.   Original Report Authenticated By: Lupita Raider.,  M.D.   Personally viewed.   Telemetry: No adverse arrhythmias Personally viewed.   EKG:  No ST  segment elevation  Cardiac Studies:  Echocardiogram, no wall motion and mildly, no pericardial effusion, normal EF.  Assessment/Plan:   47 year old male smoker with coronary artery disease status post RCA stent admitted with subjective fevers, sweats, upper respiratory like infection, normal appearing EKG, normal appearing x-ray, normal EF, no pericardial effusion with initial troponin of 20, now improving. Smoker.   1. Elevated troponin/non-ST elevation myocardial infarction-I have reviewed admitting physician's note and I agree that there is a strong possibility of myopericarditis present. His EKG does not demonstrate any ST segment elevation however. Echocardiogram reassuring. Repeat troponin pending. If decreased, I will likely discontinue heparin for he has been on now greater than 24 hours. This will also reduce risk of hemorrhagic transformation if he doesn't fact have pericarditis.   Yesterday he did have fever, no further fever however currently. Still with night sweats. I have asked hospitalist team to evaluate. Appreciate their assistance. Blood cultures have been drawn. Certainly there is also a possibility of endocarditis given his persistent infection-like state, fevers, sweats.   There is still a possibility of coronary artery disease as the ultimate cause of his symptoms however he also has subjective fevers, objective fevers, night sweats which concern me more for an inflammatory process such as myopericarditis which could occur potentially after an acute coronary event. If blood cultures are negative and he  remains afebrile, he would not be unreasonable to perform angiography to further assess. For now, reassuring ejection fraction.   I will give him ibuprofen 800 mg 3 times a day.   2. Tobacco cessation discussed   3. Coronary artery disease-as described above.   4. Leukocytosis/fever-appreciate tried hospitalist workup, assistance. May in fact require transesophageal  echocardiogram if blood cultures are positive.   Complex medical decision-making. Potentially life-threatening illness.  Discussed with he and his wife.  SKAINS, MARK 12/16/2012, 9:09 AM

## 2012-12-16 NOTE — Progress Notes (Signed)
ECHO personally viewed and does show base to mid akinesis of inferoseptal/posterior wall.  Prior stent prox RCA I would like to proceed with cardiac cath. Discussed radial approach. Risks of bleeding, stroke, MI, death discussed.   Will set up for tomorrow at around noon.   Hydrate overnight. Getting CT of abdomen today with history of diverticulitis. No current abd pain. No fever currently.

## 2012-12-16 NOTE — Progress Notes (Signed)
ANTICOAGULATION CONSULT NOTE - Follow Up Consult  Pharmacy Consult:  Heparin Indication: chest pain/ACS  No Known Allergies  Patient Measurements: Height: 5\' 11"  (180.3 cm) Weight: 185 lb (83.915 kg) IBW/kg (Calculated) : 75.3 Heparin Dosing Weight: 80 kg  Vital Signs: Temp: 98.9 F (37.2 C) (05/14 1342) Temp src: Oral (05/14 1342) BP: 143/75 mmHg (05/14 1342) Pulse Rate: 82 (05/14 1342)  Labs:  Recent Labs  12/14/12 0450  12/15/12 0535 12/15/12 1830 12/15/12 1850 12/16/12 0540 12/16/12 1328 12/16/12 1644  HGB 12.0*  --  12.1*  --   --  12.6* 11.8*  --   HCT 36.2*  --  35.8*  --   --  35.9* 34.8*  --   PLT 234  --  225  --   --  188 241  --   HEPARINUNFRC  --   < >  --   --  0.16* 0.23*  --  0.24*  CREATININE 0.86  --   --   --   --  0.71 0.92  --   TROPONINI  --   --   --  7.24*  --   --  4.36*  --   < > = values in this interval not displayed.  Estimated Creatinine Clearance: 106.9 ml/min (by C-G formula based on Cr of 0.92).     Assessment: 47 y/o male patient admitted with chest pain to continue on IV heparin.  Heparin level remains subtherapeutic and not moving much, will increase gtt further. Plan for cath in am.  Goal of Therapy:  Heparin level 0.3-0.7 units/ml Monitor platelets by anticoagulation protocol: Yes    Plan:  Increase gtt to 2250 units/hr and check 6 hour level.  Verlene Mayer, PharmD, BCPS Pager [phone] 12/16/2012, 6:06 PM

## 2012-12-16 NOTE — Progress Notes (Signed)
[facility] PROGRESS NOTE  Brett Davis ZOX:[id number] DOB: 20-Oct-1965 DOA: 12/13/2012 PCP: Warrick Parisian, MD  Assessment/Plan: FUO -Patient had temperature 100.9 on 12/13/12 @2100  -No fever since then -Patient diagnosed with diverticulitis 11/20/2012 -Was only partially compliant with antibiotics -Has had previous surgical history for diverticulitis -CT abdomen and pelvis -If CT is negative, consider Panorex for poor dentition if fever returns -??related to myopericarditis -UA not suggestive of UTI -Blood cultures negative at 24 hours Leukocytosis -Appears that the patient has had chronic leukocytosis dating back to 06/2011 NSTEMI -per cardiology -plans noted for cath Tobacco abuse -Tobacco cessation discussed History of psoriatic arthritis -Patient has not received methotrexate in approximately 2 weeks -Continue to hold methotrexate for now    Family Communication:   fiancee at beside updated Disposition Plan:   Home when medically stable      Procedures/Studies: Dg Chest 2 View  12/15/2012   *RADIOLOGY REPORT*  Clinical Data: Fever  CHEST - 2 VIEW  Comparison: Dec 13, 2012.  Findings: Cardiomediastinal silhouette appears normal.  No acute pulmonary disease is noted.  Bony thorax is intact.  IMPRESSION: No acute cardiopulmonary abnormality seen.   Original Report Authenticated By: Lupita Raider.,  M.D.   Ct Abdomen Pelvis W Contrast  11/20/2012   *RADIOLOGY REPORT*  Clinical Data: Left lower quadrant pain.  Nausea diarrhea.  History of diverticulitis with colon resection.  CT ABDOMEN AND PELVIS WITH CONTRAST  Technique:  Multidetector CT imaging of the abdomen and pelvis was performed following the standard protocol during bolus administration of intravenous contrast.  Contrast: OMNIPAQUE IOHEXOL 300 MG/ML  SOLN  Comparison: [place] [facility] at [address] abdomen and pelvis exam from 12/28/2008  Findings: Lungs are clear bilaterally.  No focal  abnormalities seen in the liver or spleen.  Moderate hiatal hernia is noted.  Surgical clips are seen in the region of the esophagogastric junction and the patient is status post hiatal hernia repair.  The duodenum, pancreas, and adrenal glands are unremarkable.  Gallbladder is decompressed.  Right kidney is unremarkable.  2 mm nonobstructing stone is identified in the interpolar left kidney.  No abdominal aortic aneurysm.  No free fluid or lymphadenopathy in the abdomen.  Imaging through the pelvis shows no free intraperitoneal fluid. There is no pelvic sidewall lymphadenopathy.  Bladder is unremarkable.  Diverticular changes are seen in the left colon and there is some edema/inflammation adjacent to the mid descending segment.  No extraluminal gas or pericolonic abscess at this time. The terminal ileum is normal.  The appendix is normal.  Bone windows reveal no worrisome lytic or sclerotic osseous lesions.  IMPRESSION: Wall thickening with pericolonic edema/inflammation of the mid descending colon is associated with diverticular change.  Imaging features are most suggestive of diverticulitis without perforation or abscess.  As colonic neoplasm can present with similar imaging features, close follow-up is suggested.  Moderate recurrent hiatal hernia.   Original Report Authenticated By: Kennith Center, M.D.   Dg Chest Port 1 View  12/13/2012   *RADIOLOGY REPORT*  Clinical Data: Chest pain and, hypertension.  PORTABLE CHEST - 1 VIEW  Comparison: 07/22/2012  Findings: Cardiomegaly.  No focal airspace opacities or effusions. No acute bony abnormality.  IMPRESSION: Cardiomegaly.  No acute findings.   Original Report Authenticated By: Charlett Nose, M.D.         Subjective: Patient denies fevers, chills, chest pain, shortness breath, nausea, vomiting, diarrhea, abdominal pain, dysuria, hematuria.  Objective: Filed Vitals:   12/15/12 1357 12/15/12 1959  12/16/12 0349 12/16/12 1342  BP: 130/73 135/77 132/88  143/75  Pulse: 86 82 62 82  Temp: 98.3 F (36.8 C) 98.5 F (36.9 C) 97.5 F (36.4 C) 98.9 F (37.2 C)  TempSrc: Oral Oral Oral Oral  Resp: 16 18 18 19   Height:      Weight:      SpO2: 100% 99% 100% 100%    Intake/Output Summary (Last 24 hours) at 12/16/12 1857 Last data filed at 12/16/12 1230  Gross per 24 hour  Intake    480 ml  Output      0 ml  Net    480 ml   Weight change:  Exam:   General:  Pt is alert, follows commands appropriately, not in acute distress  HEENT: No icterus, No thrush, [place]/AT  Cardiovascular: RRR, S1/S2, no rubs, no gallops  Respiratory: CTA bilaterally, no wheezing, no crackles, no rhonchi  Abdomen: Soft/+BS, non tender, non distended, no guarding  Extremities: No edema, No lymphangitis, No petechiae, No rashes, no synovitis  Data Reviewed: Basic Metabolic Panel:  Recent Labs Lab 12/13/12 2200 12/13/12 2354 12/14/12 0450 12/16/12 0540 12/16/12 1328  NA 138  --  141 138 142  K 3.8  --  3.6 4.4 3.7  CL 102  --  106 106 105  CO2 28  --  28 21 26   GLUCOSE 112*  --  122* 94 90  BUN 11  --  12 9 8   CREATININE 0.90  --  0.86 0.71 0.92  CALCIUM 9.0  --  8.5 8.5 9.0  MG  --  2.0  --   --   --    Liver Function Tests:  Recent Labs Lab 12/16/12 0540 12/16/12 1328  AST 17 17  ALT 12 13  ALKPHOS 43 42  BILITOT 0.3 0.2*  PROT 6.1 6.3  ALBUMIN 2.7* 2.6*   No results found for this basename: LIPASE, AMYLASE,  in the last 168 hours No results found for this basename: AMMONIA,  in the last 168 hours CBC:  Recent Labs Lab 12/13/12 2200 12/14/12 0450 12/15/12 0535 12/16/12 0540 12/16/12 1328  WBC 19.2* 18.4* 18.2* 13.8* 12.5*  NEUTROABS  --   --   --   --  8.2*  HGB 12.7* 12.0* 12.1* 12.6* 11.8*  HCT 37.2* 36.2* 35.8* 35.9* 34.8*  MCV 95.4 96.5 95.5 93.7 95.6  PLT 262 234 225 188 241   Cardiac Enzymes:  Recent Labs Lab 12/15/12 1830 12/16/12 1328  TROPONINI 7.24* 4.36*   BNP: No components found with this basename:  POCBNP,  CBG: No results found for this basename: GLUCAP,  in the last 168 hours  No results found for this or any previous visit (from the past 240 hour(s)).   Scheduled Meds: . aspirin EC  81 mg Oral Daily  . ibuprofen  800 mg Oral TID  . nebivolol  5 mg Oral q morning - 10a  . nicotine  14 mg Transdermal Daily  . pantoprazole  40 mg Oral Daily  . simvastatin  10 mg Oral QHS   Continuous Infusions: . heparin 2,250 Units/hr (12/16/12 1849)     Ponce Skillman, DO  [facility] Pager [phone]  If 7PM-7AM, please contact night-coverage [email or web address] Password [facility] 12/16/2012, 6:57 PM   LOS: 3 days

## 2012-12-17 ENCOUNTER — Encounter (HOSPITAL_COMMUNITY)
Admission: EM | Disposition: A | Discharge status: Home / Self Care | Payer: Self-pay | Source: Home / Self Care | Attending: Internal Medicine

## 2012-12-17 DIAGNOSIS — L4050 Arthropathic psoriasis, unspecified: Secondary | ICD-10-CM

## 2012-12-17 HISTORY — PX: LEFT HEART CATHETERIZATION WITH CORONARY ANGIOGRAM: SHX5451

## 2012-12-17 LAB — CBC
Hemoglobin: 11.9 g/dL — ABNORMAL LOW (ref 13.0–17.0)
MCH: 32.4 pg (ref 26.0–34.0)
MCHC: 34 g/dL (ref 30.0–36.0)
MCV: 95.4 fL (ref 78.0–100.0)
Platelets: 259 10*3/uL (ref 150–400)
RBC: 3.67 MIL/uL — ABNORMAL LOW (ref 4.22–5.81)

## 2012-12-17 LAB — HEPARIN LEVEL (UNFRACTIONATED): Heparin Unfractionated: 0.27 IU/mL — ABNORMAL LOW (ref 0.30–0.70)

## 2012-12-17 LAB — PROTIME-INR: Prothrombin Time: 12.6 seconds (ref 11.6–15.2)

## 2012-12-17 SURGERY — LEFT HEART CATHETERIZATION WITH CORONARY ANGIOGRAM
Anesthesia: LOCAL

## 2012-12-17 MED ORDER — ONDANSETRON HCL 4 MG/2ML IJ SOLN: 4.0000 mg | Freq: Four times a day (QID) | INTRAMUSCULAR | Status: DC | PRN

## 2012-12-17 MED ORDER — HEPARIN SODIUM (PORCINE) 1000 UNIT/ML IJ SOLN
INTRAMUSCULAR | Status: AC
  Filled 2012-12-17: qty 1

## 2012-12-17 MED ORDER — MIDAZOLAM HCL 2 MG/2ML IJ SOLN
INTRAMUSCULAR | Status: AC
  Filled 2012-12-17: qty 2

## 2012-12-17 MED ORDER — FENTANYL CITRATE 0.05 MG/ML IJ SOLN
INTRAMUSCULAR | Status: AC
  Filled 2012-12-17: qty 2

## 2012-12-17 MED ORDER — CLOPIDOGREL BISULFATE 75 MG PO TABS
75.0000 mg | ORAL_TABLET | Freq: Every day | ORAL | Status: DC
  Administered 2012-12-17 – 2012-12-18 (×2): 75 mg via ORAL
  Filled 2012-12-17 (×2): qty 1

## 2012-12-17 MED ORDER — ACETAMINOPHEN 325 MG PO TABS: 650.0000 mg | ORAL_TABLET | ORAL | Status: DC | PRN

## 2012-12-17 MED ORDER — LIDOCAINE HCL (PF) 1 % IJ SOLN
INTRAMUSCULAR | Status: AC
  Filled 2012-12-17: qty 30

## 2012-12-17 MED ORDER — HEART ATTACK BOUNCING BOOK
Freq: Once | Status: AC
  Administered 2012-12-18
  Filled 2012-12-17: qty 1

## 2012-12-17 MED ORDER — VERAPAMIL HCL 2.5 MG/ML IV SOLN
INTRAVENOUS | Status: AC
  Filled 2012-12-17: qty 2

## 2012-12-17 MED ORDER — HEPARIN (PORCINE) IN NACL 2-0.9 UNIT/ML-% IJ SOLN
INTRAMUSCULAR | Status: AC
  Filled 2012-12-17: qty 1000

## 2012-12-17 NOTE — Interval H&P Note (Signed)
History and Physical Interval Note:  12/17/2012 12:28 PM  Brett Davis  has presented today for surgery, with the diagnosis of [place], CAD prior RCA stent  The various methods of treatment have been discussed with the patient and family. After consideration of risks, benefits and other options for treatment, the patient has consented to  Procedure(s): LEFT HEART CATHETERIZATION WITH CORONARY ANGIOGRAM (N/A) as a surgical intervention .  The patient's history has been reviewed, patient examined, no change in status, stable for surgery.  I have reviewed the patient's chart and labs.  Questions were answered to the patient's satisfaction.     Brett Davis

## 2012-12-17 NOTE — CV Procedure (Addendum)
CARDIAC CATHETERIZATION  PROCEDURE:  Left heart catheterization with selective coronary angiography, left ventriculogram via the radial artery approach.  INDICATIONS:  47 year old smoker with hypertension, hyperlipidemia, prior coronary artery disease with RCA BMS in 2009 who presented with troponin of 20, all electrically silent EKG with no ST segment changes, base to mid inferior wall motion abnormality, EF 55% with fevers, night sweats, upper respiratory infection-like symptoms for 2-3 weeks. He stated that 2 days prior to admission he felt stuttering chest discomfort, neck discomfort that was similar to his prior MI. This in total lasted about with spontaneous relief.  He also had an elevated white count, fever of 101, night sweats. He has been treated with anti-inflammatories for possible myopericarditis for the past 2 days as well as IV heparin. He has not had any further fevers. Blood cultures have been drawn and thus far are negative.  The risks, benefits, and details of the procedure were explained to the patient, including possibilities of stroke, heart attack, death, renal impairment, arterial damage, bleeding.  The patient verbalized understanding and wanted to proceed.  Informed written consent was obtained.  PROCEDURE TECHNIQUE:  Allen's test was performed pre-and post procedure and was normal. The right radial artery site was prepped and draped in a sterile fashion. One percent lidocaine was used for local anesthesia. Using the modified Seldinger technique a 5 French hydrophilic sheath was inserted into the radial artery without difficulty. 3 mg of verapamil was administered via the sheath. A Judkins right #4 catheter with the guidance of a Versicore wire was placed in the right coronary cusp and selectively cannulated the right coronary artery. After traversing the aortic arch, 4000 units of heparin IV was administered. A Judkins left #3.5 catheter was used to selectively cannulate the  left main artery. Multiple views with hand injection of Omnipaque were obtained. Catheter a pigtail catheter was used to cross into the left ventricle, hemodynamics were obtained, and a left ventriculogram was performed in the RAO position with power injection. Following the procedure, sheath was removed, patient was hemodynamically stable, hemostasis was maintained with a Terumo T band.   CONTRAST:  Total of 60 ml.    FLOUROSCOPY TIME: 1.8 min.  COMPLICATIONS:  None.    HEMODYNAMICS:  Aortic pressure was 144/29mmHg; LV systolic pressure was ; LVEDP .  There was no gradient between the left ventricle and aorta.    ANGIOGRAPHIC DATA:    Left main: Branches into LAD and circumflex, no angiographically significant disease.  Left anterior descending (LAD): Minor luminal irregularities throughout vessel, 2 significant diagonal branches, first diagonal branch has mid stenosis of 60-70%. Relatively small caliber vessel.  Circumflex artery (CIRC): There is a total occlusion of the proximal/AV groove. Large first obtuse marginal branch patent with 40-50% hazy stenosis proximally. There is a smaller second obtuse marginal branch just prior to the occlusion. Minor luminal irregularities. This occlusion is new when compared to prior cardiac catheterization.  Right coronary artery (RCA): Previously placed stent in the proximal right coronary artery is widely patent. Minor luminal irregularities throughout distal segment. This gives posterior descending artery. Late in angiogram, there does appear to be some collateralization to the circumflex artery.  LEFT VENTRICULOGRAM:  Left ventricular angiogram was done in the 30 RAO projection and revealed an estimated ejection fraction of 50%, with basal/mid inferior wall hypokinesis.  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 Culprit vessel. Previously placed  RCA stent is  widely patent. Right to left collateral blood flow to circumflex artery modest. Base to mid inferior wall hypokinesis.  LVEDP 14 mmHg.  Ejection fraction 50%. It is likely that occlusion occurred approximately 2 days prior to admission resulting in elevated troponin on admission.   RECOMMENDATION:  Discussing films with Dr. Katrinka Blazing. Given the complete total occlusion, completed infarct, there is no benefit to percutaneous intervention to this vessel. We will monitor overnight. I will start Plavix, continue aspirin. Close monitoring.

## 2012-12-17 NOTE — Progress Notes (Signed)
TR BAND REMOVAL  LOCATION:    right radial  DEFLATED PER PROTOCOL:    yes  TIME BAND OFF / DRESSING APPLIED:    1500 SITE UPON ARRIVAL:    Level 0  SITE AFTER BAND REMOVAL:    Level 0  REVERSE ALLEN'S TEST:     positive  CIRCULATION SENSATION AND MOVEMENT:    Within Normal Limits   yes  COMMENTS:   Gauze dressing applied at 1500    gauze dressing dry and intact level 0 positive reverse allens, csms wnls,radial and ulnar pulses +2

## 2012-12-17 NOTE — Progress Notes (Signed)
ANTICOAGULATION CONSULT NOTE - Follow Up Consult  Pharmacy Consult:  Heparin Indication: chest pain/ACS  No Known Allergies  Patient Measurements: Height: 5\' 11"  (180.3 cm) Weight: 185 lb (83.915 kg) IBW/kg (Calculated) : 75.3 Heparin Dosing Weight: 80 kg  Vital Signs: Temp: 98.5 F (36.9 C) (05/14 1946) Temp src: Oral (05/14 1946) BP: 134/85 mmHg (05/14 2247) Pulse Rate: 82 (05/14 2247)  Labs:  Recent Labs  12/14/12 0450  12/15/12 0535 12/15/12 1830  12/16/12 0540 12/16/12 1328 12/16/12 1644 12/17/12 0023  HGB 12.0*  --  12.1*  --   --  12.6* 11.8*  --   --   HCT 36.2*  --  35.8*  --   --  35.9* 34.8*  --   --   PLT 234  --  225  --   --  188 241  --   --   HEPARINUNFRC  --   < >  --   --   < > 0.23*  --  0.24* 0.27*  CREATININE 0.86  --   --   --   --  0.71 0.92  --   --   TROPONINI  --   --   --  7.24*  --   --  4.36*  --   --   < > = values in this interval not displayed.  Estimated Creatinine Clearance: 106.9 ml/min (by C-G formula based on Cr of 0.92).  Assessment: 47 y/o male patient admitted with chest pain to continue on IV heparin.  Heparin level (0.27) is below-goal on  2250 units/hr. No problem with line / infusion and no bleeding per RN. Plan for cath in AM.  Goal of Therapy:  Heparin level 0.3-0.7 units/ml Monitor platelets by anticoagulation protocol: Yes   Plan:  1. Increase IV heparin to 2500 units/hr.  2. Heparin level in 6 hours vs follow-up post-cath.   Lorre Munroe, PharmD 12/17/2012, 1:07 AM

## 2012-12-17 NOTE — Progress Notes (Signed)
[facility] PROGRESS NOTE  HIKARU DELORENZO BJY:[id number] DOB: [birth year] DOA: 12/13/2012 PCP: Warrick Parisian, MD  Assessment/Plan: FUO  -Patient had temperature 100.9 on 12/13/12 @2100  but stated he had subjective fevers for one week prior to admission -No fever since then --patient has been on ibuprofen which may suppress fever -Patient diagnosed with diverticulitis 11/20/2012  -Was only partially compliant with antibiotics  -Has had previous surgical history for diverticulitis  -CT abdomen and pelvis--negative for diverticulitis or abscess; normal small and large bowel -CT chest without any acute findings -Panorex for poor dentition if fever returns  -??related to myopericarditis  -UA not suggestive of UTI  -Blood cultures negative at 48 hours  -I have asked the patient to keep a temperature log for 2 weeks when he goes home. I provided him instructions and he expressed understanding. He will take his temperature log his primary care physician -No further workup if remains afebrile Leukocytosis  -Appears that the patient has had chronic leukocytosis dating back to 06/2011  NSTEMI  -per cardiology  Tobacco abuse  -Tobacco cessation discussed  -Continue nicotine patch History of psoriatic arthritis  -Patient has not received methotrexate in approximately 2 weeks  -Continue to hold methotrexate for now      Procedures/Studies: Dg Chest 2 View  12/15/2012   *RADIOLOGY REPORT*  Clinical Data: Fever  CHEST - 2 VIEW  Comparison: Dec 13, 2012.  Findings: Cardiomediastinal silhouette appears normal.  No acute pulmonary disease is noted.  Bony thorax is intact.  IMPRESSION: No acute cardiopulmonary abnormality seen.   Original Report Authenticated By: Lupita Raider.,  M.D.   Ct Chest W Contrast  12/16/2012   *RADIOLOGY REPORT*  Clinical Data:  Lower abdominal pain, fever.  History of diverticulitis.  Fever of unknown origin.  CT CHEST, ABDOMEN AND PELVIS WITH CONTRAST   Technique:  Multidetector CT imaging of the chest, abdomen and pelvis was performed following the standard protocol during bolus administration of intravenous contrast.  Contrast: OMNIPAQUE IOHEXOL 300 MG/ML  SOLN  Comparison:  chest x-ray 12/15/2012.  CT of the abdomen pelvis 11/20/2012.  CT CHEST  Findings:  Lungs are clear.  No focal airspace opacities or suspicious nodules.  No effusions.  Heart is borderline in size. Aorta is normal caliber.  No dissection.  Fluid density anterior to the ascending aorta and aortic arch felt to represent fluid within the pericardial recess. No mediastinal, hilar, or axillary adenopathy.  Visualized thyroid and chest wall soft tissues unremarkable.  Small hiatal hernia.  No acute bony abnormality.  IMPRESSION: No acute findings in the chest.  Small hiatal hernia.  Mild cardiomegaly.  CT ABDOMEN AND PELVIS  Findings:  Liver, gallbladder, spleen, pancreas, adrenals and right kidney are unremarkable.  Tiny punctate nonobstructing stone in the mid pole of the left kidney.  No ureteral stones or hydronephrosis.  Urinary bladder appears slightly thick-walled.  Cannot exclude infection/cystitis.  Recommend clinical correlation.  Appendix is visualized and is normal.  Large and small bowel grossly unremarkable.  Trace free fluid in the pelvis.  Aorta is normal caliber.  No acute bony abnormality.  IMPRESSION: Left nephrolithiasis.  Slight bladder wall thickening, question cystitis.  Recommend clinical correlation.   Original Report Authenticated By: Charlett Nose, M.D.   Ct Abdomen Pelvis W Contrast  12/16/2012   *RADIOLOGY REPORT*  Clinical Data:  Lower abdominal pain, fever.  History of diverticulitis.  Fever of unknown origin.  CT CHEST, ABDOMEN AND PELVIS WITH CONTRAST  Technique:  Multidetector CT imaging  of the chest, abdomen and pelvis was performed following the standard protocol during bolus administration of intravenous contrast.  Contrast: OMNIPAQUE IOHEXOL 300  MG/ML  SOLN  Comparison:  chest x-ray 12/15/2012.  CT of the abdomen pelvis 11/20/2012.  CT CHEST  Findings:  Lungs are clear.  No focal airspace opacities or suspicious nodules.  No effusions.  Heart is borderline in size. Aorta is normal caliber.  No dissection.  Fluid density anterior to the ascending aorta and aortic arch felt to represent fluid within the pericardial recess. No mediastinal, hilar, or axillary adenopathy.  Visualized thyroid and chest wall soft tissues unremarkable.  Small hiatal hernia.  No acute bony abnormality.  IMPRESSION: No acute findings in the chest.  Small hiatal hernia.  Mild cardiomegaly.  CT ABDOMEN AND PELVIS  Findings:  Liver, gallbladder, spleen, pancreas, adrenals and right kidney are unremarkable.  Tiny punctate nonobstructing stone in the mid pole of the left kidney.  No ureteral stones or hydronephrosis.  Urinary bladder appears slightly thick-walled.  Cannot exclude infection/cystitis.  Recommend clinical correlation.  Appendix is visualized and is normal.  Large and small bowel grossly unremarkable.  Trace free fluid in the pelvis.  Aorta is normal caliber.  No acute bony abnormality.  IMPRESSION: Left nephrolithiasis.  Slight bladder wall thickening, question cystitis.  Recommend clinical correlation.   Original Report Authenticated By: Charlett Nose, M.D.   Ct Abdomen Pelvis W Contrast  11/20/2012   *RADIOLOGY REPORT*  Clinical Data: Left lower quadrant pain.  Nausea diarrhea.  History of diverticulitis with colon resection.  CT ABDOMEN AND PELVIS WITH CONTRAST  Technique:  Multidetector CT imaging of the abdomen and pelvis was performed following the standard protocol during bolus administration of intravenous contrast.  Contrast: OMNIPAQUE IOHEXOL 300 MG/ML  SOLN  Comparison: [place] [facility] at [address] abdomen and pelvis exam from 12/28/2008  Findings: Lungs are clear bilaterally.  No focal abnormalities seen in the liver or spleen.  Moderate hiatal  hernia is noted.  Surgical clips are seen in the region of the esophagogastric junction and the patient is status post hiatal hernia repair.  The duodenum, pancreas, and adrenal glands are unremarkable.  Gallbladder is decompressed.  Right kidney is unremarkable.  2 mm nonobstructing stone is identified in the interpolar left kidney.  No abdominal aortic aneurysm.  No free fluid or lymphadenopathy in the abdomen.  Imaging through the pelvis shows no free intraperitoneal fluid. There is no pelvic sidewall lymphadenopathy.  Bladder is unremarkable.  Diverticular changes are seen in the left colon and there is some edema/inflammation adjacent to the mid descending segment.  No extraluminal gas or pericolonic abscess at this time. The terminal ileum is normal.  The appendix is normal.  Bone windows reveal no worrisome lytic or sclerotic osseous lesions.  IMPRESSION: Wall thickening with pericolonic edema/inflammation of the mid descending colon is associated with diverticular change.  Imaging features are most suggestive of diverticulitis without perforation or abscess.  As colonic neoplasm can present with similar imaging features, close follow-up is suggested.  Moderate recurrent hiatal hernia.   Original Report Authenticated By: Kennith Center, M.D.   Dg Chest Port 1 View  12/13/2012   *RADIOLOGY REPORT*  Clinical Data: Chest pain and, hypertension.  PORTABLE CHEST - 1 VIEW  Comparison: 07/22/2012  Findings: Cardiomegaly.  No focal airspace opacities or effusions. No acute bony abnormality.  IMPRESSION: Cardiomegaly.  No acute findings.   Original Report Authenticated By: Charlett Nose, M.D.  Subjective: Patient denies fevers, chills, chest pain, shortness breath, nausea, vomiting, diarrhea, vomiting, dysuria, hematuria. No rashes, headache.  Objective: Filed Vitals:   12/17/12 1212 12/17/12 1600 12/17/12 1700 12/17/12 1800  BP:  164/89 140/73 115/85  Pulse: 73 91    Temp:  97.7 F (36.5 C)     TempSrc:  Oral    Resp:      Height:      Weight:      SpO2:  98%      Intake/Output Summary (Last 24 hours) at 12/17/12 1913 Last data filed at 12/17/12 0730  Gross per 24 hour  Intake    750 ml  Output      0 ml  Net    750 ml   Weight change:  Exam:   General:  Pt is alert, follows commands appropriately, not in acute distress  HEENT: No icterus, No thrush,  [place]/AT  Cardiovascular: RRR, S1/S2, no rubs, no gallops  Respiratory: CTA bilaterally, no wheezing, no crackles, no rhonchi  Abdomen: Soft/+BS, non tender, non distended, no guarding  Extremities: No edema, No lymphangitis, No petechiae, No rashes, no synovitis  Data Reviewed: Basic Metabolic Panel:  Recent Labs Lab 12/13/12 2200 12/13/12 2354 12/14/12 0450 12/16/12 0540 12/16/12 1328  NA 138  --  141 138 142  K 3.8  --  3.6 4.4 3.7  CL 102  --  106 106 105  CO2 28  --  28 21 26   GLUCOSE 112*  --  122* 94 90  BUN 11  --  12 9 8   CREATININE 0.90  --  0.86 0.71 0.92  CALCIUM 9.0  --  8.5 8.5 9.0  MG  --  2.0  --   --   --    Liver Function Tests:  Recent Labs Lab 12/16/12 0540 12/16/12 1328  AST 17 17  ALT 12 13  ALKPHOS 43 42  BILITOT 0.3 0.2*  PROT 6.1 6.3  ALBUMIN 2.7* 2.6*   No results found for this basename: LIPASE, AMYLASE,  in the last 168 hours No results found for this basename: AMMONIA,  in the last 168 hours CBC:  Recent Labs Lab 12/14/12 0450 12/15/12 0535 12/16/12 0540 12/16/12 1328 12/17/12 0803  WBC 18.4* 18.2* 13.8* 12.5* 11.5*  NEUTROABS  --   --   --  8.2*  --   HGB 12.0* 12.1* 12.6* 11.8* 11.9*  HCT 36.2* 35.8* 35.9* 34.8* 35.0*  MCV 96.5 95.5 93.7 95.6 95.4  PLT 234 225 188 241 259   Cardiac Enzymes:  Recent Labs Lab 12/15/12 1830 12/16/12 1328  TROPONINI 7.24* 4.36*   BNP: No components found with this basename: POCBNP,  CBG: No results found for this basename: GLUCAP,  in the last 168 hours  Recent Results (from the past 240 hour(s))  CULTURE,  BLOOD (ROUTINE X 2)     Status: None   Collection Time    12/15/12  6:40 PM      Result Value Range Status   Specimen Description BLOOD ARM LEFT   Final   Special Requests BOTTLES DRAWN AEROBIC AND ANAEROBIC 10CC   Final   Culture  Setup Time 12/16/2012 04:38   Final   Culture     Final   Value:        BLOOD CULTURE RECEIVED NO GROWTH TO DATE CULTURE WILL BE HELD FOR 5 DAYS BEFORE ISSUING A FINAL NEGATIVE REPORT   Report Status PENDING   Incomplete  CULTURE, BLOOD (ROUTINE  X 2)     Status: None   Collection Time    12/15/12  7:00 PM      Result Value Range Status   Specimen Description BLOOD HAND LEFT   Final   Special Requests BOTTLES DRAWN AEROBIC AND ANAEROBIC 10CC   Final   Culture  Setup Time 12/16/2012 04:39   Final   Culture     Final   Value:        BLOOD CULTURE RECEIVED NO GROWTH TO DATE CULTURE WILL BE HELD FOR 5 DAYS BEFORE ISSUING A FINAL NEGATIVE REPORT   Report Status PENDING   Incomplete     Scheduled Meds: . aspirin EC  81 mg Oral Daily  . clopidogrel  75 mg Oral Q breakfast  . ibuprofen  800 mg Oral TID  . nebivolol  5 mg Oral q morning - 10a  . nicotine  14 mg Transdermal Daily  . pantoprazole  40 mg Oral Daily  . simvastatin  10 mg Oral QHS   Continuous Infusions:    Adelia Baptista, DO  [facility] Pager [phone]  If 7PM-7AM, please contact night-coverage [email or web address] Password [email or web address] 12/17/2012, 7:13 PM   LOS: 4 days

## 2012-12-17 NOTE — Progress Notes (Signed)
Subjective:  No chest pain, no shortness of breath no recent fevers. CT of abdomen/chest unremarkable. No evidence of infection. Prior diverticulitis. Awaiting cardiac catheterization today. Wife in room.  Objective:  Vital Signs in the last 24 hours: Temp:  [97.1 F (36.2 C)-98.9 F (37.2 C)] 97.1 F (36.2 C) (05/15 0455) Pulse Rate:  [67-82] 67 (05/15 0455) Resp:  [18-19] 18 (05/15 0455) BP: (130-147)/(75-97) 130/87 mmHg (05/15 0455) SpO2:  [98 %-100 %] 98 % (05/15 0455)  Intake/Output from previous day: 05/14 0701 - 05/15 0700 In: 1230 [P.O.:480; I.V.:750] Out: -    Physical Exam: General: Well developed, well nourished, in no acute distress. Head:  Normocephalic and atraumatic. Lungs: Clear to auscultation and percussion. Heart: Normal S1 and S2.  No murmur, rubs or gallops.  Abdomen: soft, non-tender, positive bowel sounds. Extremities: No clubbing or cyanosis. No edema. Neurologic: Alert and oriented x 3.    Lab Results:  Recent Labs  12/16/12 1328 12/17/12 0803  WBC 12.5* 11.5*  HGB 11.8* 11.9*  PLT 241 259    Recent Labs  12/16/12 0540 12/16/12 1328  NA 138 142  K 4.4 3.7  CL 106 105  CO2 21 26  GLUCOSE 94 90  BUN 9 8  CREATININE 0.71 0.92    Recent Labs  12/15/12 1830 12/16/12 1328  TROPONINI 7.24* 4.36*   Hepatic Function Panel  Recent Labs  12/16/12 1328  PROT 6.3  ALBUMIN 2.6*  AST 17  ALT 13  ALKPHOS 42  BILITOT 0.2*   No results found for this basename: CHOL,  in the last 72 hours No results found for this basename: PROTIME,  in the last 72 hours  Imaging: Dg Chest 2 View  12/15/2012   *RADIOLOGY REPORT*  Clinical Data: Fever  CHEST - 2 VIEW  Comparison: Dec 13, 2012.  Findings: Cardiomediastinal silhouette appears normal.  No acute pulmonary disease is noted.  Bony thorax is intact.  IMPRESSION: No acute cardiopulmonary abnormality seen.   Original Report Authenticated By: Lupita Raider.,  M.D.   Ct Chest W  Contrast  12/16/2012   *RADIOLOGY REPORT*  Clinical Data:  Lower abdominal pain, fever.  History of diverticulitis.  Fever of unknown origin.  CT CHEST, ABDOMEN AND PELVIS WITH CONTRAST  Technique:  Multidetector CT imaging of the chest, abdomen and pelvis was performed following the standard protocol during bolus administration of intravenous contrast.  Contrast: OMNIPAQUE IOHEXOL 300 MG/ML  SOLN  Comparison:  chest x-ray 12/15/2012.  CT of the abdomen pelvis 11/20/2012.  CT CHEST  Findings:  Lungs are clear.  No focal airspace opacities or suspicious nodules.  No effusions.  Heart is borderline in size. Aorta is normal caliber.  No dissection.  Fluid density anterior to the ascending aorta and aortic arch felt to represent fluid within the pericardial recess. No mediastinal, hilar, or axillary adenopathy.  Visualized thyroid and chest wall soft tissues unremarkable.  Small hiatal hernia.  No acute bony abnormality.  IMPRESSION: No acute findings in the chest.  Small hiatal hernia.  Mild cardiomegaly.  CT ABDOMEN AND PELVIS  Findings:  Liver, gallbladder, spleen, pancreas, adrenals and right kidney are unremarkable.  Tiny punctate nonobstructing stone in the mid pole of the left kidney.  No ureteral stones or hydronephrosis.  Urinary bladder appears slightly thick-walled.  Cannot exclude infection/cystitis.  Recommend clinical correlation.  Appendix is visualized and is normal.  Large and small bowel grossly unremarkable.  Trace free fluid in the pelvis.  Aorta is  normal caliber.  No acute bony abnormality.  IMPRESSION: Left nephrolithiasis.  Slight bladder wall thickening, question cystitis.  Recommend clinical correlation.   Original Report Authenticated By: Charlett Nose, M.D.   Ct Abdomen Pelvis W Contrast  12/16/2012   *RADIOLOGY REPORT*  Clinical Data:  Lower abdominal pain, fever.  History of diverticulitis.  Fever of unknown origin.  CT CHEST, ABDOMEN AND PELVIS WITH CONTRAST  Technique:   Multidetector CT imaging of the chest, abdomen and pelvis was performed following the standard protocol during bolus administration of intravenous contrast.  Contrast: OMNIPAQUE IOHEXOL 300 MG/ML  SOLN  Comparison:  chest x-ray 12/15/2012.  CT of the abdomen pelvis 11/20/2012.  CT CHEST  Findings:  Lungs are clear.  No focal airspace opacities or suspicious nodules.  No effusions.  Heart is borderline in size. Aorta is normal caliber.  No dissection.  Fluid density anterior to the ascending aorta and aortic arch felt to represent fluid within the pericardial recess. No mediastinal, hilar, or axillary adenopathy.  Visualized thyroid and chest wall soft tissues unremarkable.  Small hiatal hernia.  No acute bony abnormality.  IMPRESSION: No acute findings in the chest.  Small hiatal hernia.  Mild cardiomegaly.  CT ABDOMEN AND PELVIS  Findings:  Liver, gallbladder, spleen, pancreas, adrenals and right kidney are unremarkable.  Tiny punctate nonobstructing stone in the mid pole of the left kidney.  No ureteral stones or hydronephrosis.  Urinary bladder appears slightly thick-walled.  Cannot exclude infection/cystitis.  Recommend clinical correlation.  Appendix is visualized and is normal.  Large and small bowel grossly unremarkable.  Trace free fluid in the pelvis.  Aorta is normal caliber.  No acute bony abnormality.  IMPRESSION: Left nephrolithiasis.  Slight bladder wall thickening, question cystitis.  Recommend clinical correlation.   Original Report Authenticated By: Charlett Nose, M.D.   Personally viewed.   Telemetry: No adverse arrhythmias Personally viewed.   EKG:  No ST segment deviation  Cardiac Studies:  Prior proximal RCA stent, echocardiogram with base to mid posterior wall akinesis/hypokinesis  Assessment/Plan:    47 year old male smoker with coronary artery disease status post RCA stent admitted with subjective fevers, sweats, upper respiratory like infection, normal appearing EKG, normal  appearing x-ray, normal EF, no pericardial effusion with initial troponin of 20, now improving. Smoker.   1. Elevated troponin/non-ST elevation myocardial infarction-I have reviewed admitting physician's note and I agree that there is a strong possibility of myopericarditis present. His EKG does not demonstrate any ST segment elevation however. Echocardiogram reassuring. Repeat troponin decreasing.  No further fever. Recent night sweats. Appreciate hospitalist assistance. Blood cultures have been drawn and thus far negative. Certainly there is also a possibility of endocarditis given his persistent infection-like state, fevers, sweats.   There is still a possibility of coronary artery disease as the ultimate cause of his symptoms however he also has subjective fevers, objective fevers, night sweats which concern me more for an inflammatory process such as myopericarditis which could occur potentially after an acute coronary event. Perform angiography today to further assess. Inferior/posterior wall the base to mid akinesis/hypokinesis noted on echocardiogram with normal overall EF. He may have had old infarct prior to admission. Because of possible myopericarditis, I will give him ibuprofen 800 mg 3 times a day.  2. Tobacco cessation discussed. He is adamant about receiving patch as an outpatient. 3. Coronary artery disease-as described above.  4. Leukocytosis/fever-appreciate tried hospitalist workup, assistance. May in fact require transesophageal echocardiogram if blood cultures are positive.  Per review of records, reviewed Dr.Tat note, he has had a history of elevated white count.   Jillayne Witte 12/17/2012, 8:42 AM

## 2012-12-17 NOTE — H&P (View-Only) (Signed)
Subjective:  No chest pain, no shortness of breath no recent fevers. CT of abdomen/chest unremarkable. No evidence of infection. Prior diverticulitis. Awaiting cardiac catheterization today. Wife in room.  Objective:  Vital Signs in the last 24 hours: Temp:  [97.1 F (36.2 C)-98.9 F (37.2 C)] 97.1 F (36.2 C) (05/15 0455) Pulse Rate:  [67-82] 67 (05/15 0455) Resp:  [18-19] 18 (05/15 0455) BP: (130-147)/(75-97) 130/87 mmHg (05/15 0455) SpO2:  [98 %-100 %] 98 % (05/15 0455)  Intake/Output from previous day: 05/14 0701 - 05/15 0700 In: 1230 [P.O.:480; I.V.:750] Out: -    Physical Exam: General: Well developed, well nourished, in no acute distress. Head:  Normocephalic and atraumatic. Lungs: Clear to auscultation and percussion. Heart: Normal S1 and S2.  No murmur, rubs or gallops.  Abdomen: soft, non-tender, positive bowel sounds. Extremities: No clubbing or cyanosis. No edema. Neurologic: Alert and oriented x 3.    Lab Results:  Recent Labs  12/16/12 1328 12/17/12 0803  WBC 12.5* 11.5*  HGB 11.8* 11.9*  PLT 241 259    Recent Labs  12/16/12 0540 12/16/12 1328  NA 138 142  K 4.4 3.7  CL 106 105  CO2 21 26  GLUCOSE 94 90  BUN 9 8  CREATININE 0.71 0.92    Recent Labs  12/15/12 1830 12/16/12 1328  TROPONINI 7.24* 4.36*   Hepatic Function Panel  Recent Labs  12/16/12 1328  PROT 6.3  ALBUMIN 2.6*  AST 17  ALT 13  ALKPHOS 42  BILITOT 0.2*   No results found for this basename: CHOL,  in the last 72 hours No results found for this basename: PROTIME,  in the last 72 hours  Imaging: Dg Chest 2 View  12/15/2012   *RADIOLOGY REPORT*  Clinical Data: Fever  CHEST - 2 VIEW  Comparison: Dec 13, 2012.  Findings: Cardiomediastinal silhouette appears normal.  No acute pulmonary disease is noted.  Bony thorax is intact.  IMPRESSION: No acute cardiopulmonary abnormality seen.   Original Report Authenticated By: James Green Jr.,  M.D.   Ct Chest W  Contrast  12/16/2012   *RADIOLOGY REPORT*  Clinical Data:  Lower abdominal pain, fever.  History of diverticulitis.  Fever of unknown origin.  CT CHEST, ABDOMEN AND PELVIS WITH CONTRAST  Technique:  Multidetector CT imaging of the chest, abdomen and pelvis was performed following the standard protocol during bolus administration of intravenous contrast.  Contrast: 100mL OMNIPAQUE IOHEXOL 300 MG/ML  SOLN  Comparison:  chest x-ray 12/15/2012.  CT of the abdomen pelvis 11/20/2012.  CT CHEST  Findings:  Lungs are clear.  No focal airspace opacities or suspicious nodules.  No effusions.  Heart is borderline in size. Aorta is normal caliber.  No dissection.  Fluid density anterior to the ascending aorta and aortic arch felt to represent fluid within the pericardial recess. No mediastinal, hilar, or axillary adenopathy.  Visualized thyroid and chest wall soft tissues unremarkable.  Small hiatal hernia.  No acute bony abnormality.  IMPRESSION: No acute findings in the chest.  Small hiatal hernia.  Mild cardiomegaly.  CT ABDOMEN AND PELVIS  Findings:  Liver, gallbladder, spleen, pancreas, adrenals and right kidney are unremarkable.  Tiny punctate nonobstructing stone in the mid pole of the left kidney.  No ureteral stones or hydronephrosis.  Urinary bladder appears slightly thick-walled.  Cannot exclude infection/cystitis.  Recommend clinical correlation.  Appendix is visualized and is normal.  Large and small bowel grossly unremarkable.  Trace free fluid in the pelvis.  Aorta is   normal caliber.  No acute bony abnormality.  IMPRESSION: Left nephrolithiasis.  Slight bladder wall thickening, question cystitis.  Recommend clinical correlation.   Original Report Authenticated By: Kevin Dover, M.D.   Ct Abdomen Pelvis W Contrast  12/16/2012   *RADIOLOGY REPORT*  Clinical Data:  Lower abdominal pain, fever.  History of diverticulitis.  Fever of unknown origin.  CT CHEST, ABDOMEN AND PELVIS WITH CONTRAST  Technique:   Multidetector CT imaging of the chest, abdomen and pelvis was performed following the standard protocol during bolus administration of intravenous contrast.  Contrast: 100mL OMNIPAQUE IOHEXOL 300 MG/ML  SOLN  Comparison:  chest x-ray 12/15/2012.  CT of the abdomen pelvis 11/20/2012.  CT CHEST  Findings:  Lungs are clear.  No focal airspace opacities or suspicious nodules.  No effusions.  Heart is borderline in size. Aorta is normal caliber.  No dissection.  Fluid density anterior to the ascending aorta and aortic arch felt to represent fluid within the pericardial recess. No mediastinal, hilar, or axillary adenopathy.  Visualized thyroid and chest wall soft tissues unremarkable.  Small hiatal hernia.  No acute bony abnormality.  IMPRESSION: No acute findings in the chest.  Small hiatal hernia.  Mild cardiomegaly.  CT ABDOMEN AND PELVIS  Findings:  Liver, gallbladder, spleen, pancreas, adrenals and right kidney are unremarkable.  Tiny punctate nonobstructing stone in the mid pole of the left kidney.  No ureteral stones or hydronephrosis.  Urinary bladder appears slightly thick-walled.  Cannot exclude infection/cystitis.  Recommend clinical correlation.  Appendix is visualized and is normal.  Large and small bowel grossly unremarkable.  Trace free fluid in the pelvis.  Aorta is normal caliber.  No acute bony abnormality.  IMPRESSION: Left nephrolithiasis.  Slight bladder wall thickening, question cystitis.  Recommend clinical correlation.   Original Report Authenticated By: Kevin Dover, M.D.   Personally viewed.   Telemetry: No adverse arrhythmias Personally viewed.   EKG:  No ST segment deviation  Cardiac Studies:  Prior proximal RCA stent, echocardiogram with base to mid posterior wall akinesis/hypokinesis  Assessment/Plan:    46 year old male smoker with coronary artery disease status post RCA stent admitted with subjective fevers, sweats, upper respiratory like infection, normal appearing EKG, normal  appearing x-ray, normal EF, no pericardial effusion with initial troponin of 20, now improving. Smoker.   1. Elevated troponin/non-ST elevation myocardial infarction-I have reviewed admitting physician's note and I agree that there is a strong possibility of myopericarditis present. His EKG does not demonstrate any ST segment elevation however. Echocardiogram reassuring. Repeat troponin decreasing.  No further fever. Recent night sweats. Appreciate hospitalist assistance. Blood cultures have been drawn and thus far negative. Certainly there is also a possibility of endocarditis given his persistent infection-like state, fevers, sweats.   There is still a possibility of coronary artery disease as the ultimate cause of his symptoms however he also has subjective fevers, objective fevers, night sweats which concern me more for an inflammatory process such as myopericarditis which could occur potentially after an acute coronary event. Perform angiography today to further assess. Inferior/posterior wall the base to mid akinesis/hypokinesis noted on echocardiogram with normal overall EF. He may have had old infarct prior to admission. Because of possible myopericarditis, I will give him ibuprofen 800 mg 3 times a day.  2. Tobacco cessation discussed. He is adamant about receiving patch as an outpatient. 3. Coronary artery disease-as described above.  4. Leukocytosis/fever-appreciate tried hospitalist workup, assistance. May in fact require transesophageal echocardiogram if blood cultures are positive.   Per review of records, reviewed Dr.Tat note, he has had a history of elevated white count.   SKAINS, MARK 12/17/2012, 8:42 AM      

## 2012-12-18 ENCOUNTER — Encounter (HOSPITAL_COMMUNITY): Payer: Self-pay | Admitting: Cardiology

## 2012-12-18 DIAGNOSIS — I214 Non-ST elevation (NSTEMI) myocardial infarction: Secondary | ICD-10-CM | POA: Diagnosis present

## 2012-12-18 DIAGNOSIS — I2510 Atherosclerotic heart disease of native coronary artery without angina pectoris: Secondary | ICD-10-CM

## 2012-12-18 LAB — CBC
HCT: 33.2 % — ABNORMAL LOW (ref 39.0–52.0)
Hemoglobin: 11.2 g/dL — ABNORMAL LOW (ref 13.0–17.0)
MCHC: 33.7 g/dL (ref 30.0–36.0)

## 2012-12-18 MED ORDER — ASPIRIN 81 MG PO TBEC: 81.0000 mg | DELAYED_RELEASE_TABLET | Freq: Every day | ORAL | Status: AC

## 2012-12-18 MED ORDER — NICOTINE 14 MG/24HR TD PT24: 1.0000 | MEDICATED_PATCH | Freq: Every day | TRANSDERMAL | Status: DC

## 2012-12-18 MED ORDER — SIMVASTATIN 40 MG PO TABS
40.0000 mg | ORAL_TABLET | Freq: Every day | ORAL | Status: DC
  Filled 2012-12-18: qty 1

## 2012-12-18 MED ORDER — CLOPIDOGREL BISULFATE 75 MG PO TABS: 75.0000 mg | ORAL_TABLET | Freq: Every day | ORAL | Status: DC

## 2012-12-18 MED ORDER — SIMVASTATIN 40 MG PO TABS: 40.0000 mg | ORAL_TABLET | Freq: Every day | ORAL | Status: DC

## 2012-12-18 MED ORDER — NITROGLYCERIN 0.4 MG SL SUBL: 0.4000 mg | SUBLINGUAL_TABLET | SUBLINGUAL | Status: DC | PRN

## 2012-12-18 NOTE — Progress Notes (Signed)
[facility] PROGRESS NOTE  Brett Davis ZOX:[id number] DOB: May 07, 1966 DOA: 12/13/2012 PCP: Warrick Parisian, MD  Assessment/Plan: FUO  -Patient had temperature 100.9 on 12/13/12 @2100  but stated he had subjective fevers for one week prior to admission  -No fever since then --patient has been on ibuprofen which may suppress fever  -Patient diagnosed with diverticulitis 11/20/2012  -Was only partially compliant with antibiotics  -Has had previous surgical history for diverticulitis  -CT abdomen and pelvis--negative for diverticulitis or abscess; normal small and large bowel  -CT chest without any acute findings  -Panorex for poor dentition if fever returns  -??related to myopericarditis  -UA not suggestive of UTI  -Blood cultures negative at 72 hours -TTE neg for vegetations -TEE if positive blood cultures or if fevers return/persist  -I have asked the patient to keep a temperature log for 2 weeks when he goes home. I provided him instructions and he expressed understanding. He will take his temperature log his primary care physician  -No further workup if remains afebrile  -Patient should followup with his primary care provider postdischarge. I told the patient that if he has numerous temperatures greater than 100.71F with associated symptoms including but not limited to chest discomfort, shortness breath, vomiting, abdominal pain, diarrhea, headache he should call his physician or go to the emergency department immediately -Would not give any antibiotics at this time. Leukocytosis  -Appears that the patient has had chronic leukocytosis dating back to 06/2011  NSTEMI  -per cardiology  Tobacco abuse  -Tobacco cessation discussed  -Continue nicotine patch  History of psoriatic arthritis  -Patient has not received methotrexate in approximately 2 weeks  -Continue to hold methotrexate for now -Patient is to followup with his rheumatologist      Procedures/Studies: Dg  Chest 2 View  12/15/2012   *RADIOLOGY REPORT*  Clinical Data: Fever  CHEST - 2 VIEW  Comparison: Dec 13, 2012.  Findings: Cardiomediastinal silhouette appears normal.  No acute pulmonary disease is noted.  Bony thorax is intact.  IMPRESSION: No acute cardiopulmonary abnormality seen.   Original Report Authenticated By: Lupita Raider.,  M.D.   Ct Chest W Contrast  12/16/2012   *RADIOLOGY REPORT*  Clinical Data:  Lower abdominal pain, fever.  History of diverticulitis.  Fever of unknown origin.  CT CHEST, ABDOMEN AND PELVIS WITH CONTRAST  Technique:  Multidetector CT imaging of the chest, abdomen and pelvis was performed following the standard protocol during bolus administration of intravenous contrast.  Contrast: OMNIPAQUE IOHEXOL 300 MG/ML  SOLN  Comparison:  chest x-ray 12/15/2012.  CT of the abdomen pelvis 11/20/2012.  CT CHEST  Findings:  Lungs are clear.  No focal airspace opacities or suspicious nodules.  No effusions.  Heart is borderline in size. Aorta is normal caliber.  No dissection.  Fluid density anterior to the ascending aorta and aortic arch felt to represent fluid within the pericardial recess. No mediastinal, hilar, or axillary adenopathy.  Visualized thyroid and chest wall soft tissues unremarkable.  Small hiatal hernia.  No acute bony abnormality.  IMPRESSION: No acute findings in the chest.  Small hiatal hernia.  Mild cardiomegaly.  CT ABDOMEN AND PELVIS  Findings:  Liver, gallbladder, spleen, pancreas, adrenals and right kidney are unremarkable.  Tiny punctate nonobstructing stone in the mid pole of the left kidney.  No ureteral stones or hydronephrosis.  Urinary bladder appears slightly thick-walled.  Cannot exclude infection/cystitis.  Recommend clinical correlation.  Appendix is visualized and is normal.  Large and small bowel grossly  unremarkable.  Trace free fluid in the pelvis.  Aorta is normal caliber.  No acute bony abnormality.  IMPRESSION: Left nephrolithiasis.  Slight  bladder wall thickening, question cystitis.  Recommend clinical correlation.   Original Report Authenticated By: Charlett Nose, M.D.   Ct Abdomen Pelvis W Contrast  12/16/2012   *RADIOLOGY REPORT*  Clinical Data:  Lower abdominal pain, fever.  History of diverticulitis.  Fever of unknown origin.  CT CHEST, ABDOMEN AND PELVIS WITH CONTRAST  Technique:  Multidetector CT imaging of the chest, abdomen and pelvis was performed following the standard protocol during bolus administration of intravenous contrast.  Contrast: OMNIPAQUE IOHEXOL 300 MG/ML  SOLN  Comparison:  chest x-ray 12/15/2012.  CT of the abdomen pelvis 11/20/2012.  CT CHEST  Findings:  Lungs are clear.  No focal airspace opacities or suspicious nodules.  No effusions.  Heart is borderline in size. Aorta is normal caliber.  No dissection.  Fluid density anterior to the ascending aorta and aortic arch felt to represent fluid within the pericardial recess. No mediastinal, hilar, or axillary adenopathy.  Visualized thyroid and chest wall soft tissues unremarkable.  Small hiatal hernia.  No acute bony abnormality.  IMPRESSION: No acute findings in the chest.  Small hiatal hernia.  Mild cardiomegaly.  CT ABDOMEN AND PELVIS  Findings:  Liver, gallbladder, spleen, pancreas, adrenals and right kidney are unremarkable.  Tiny punctate nonobstructing stone in the mid pole of the left kidney.  No ureteral stones or hydronephrosis.  Urinary bladder appears slightly thick-walled.  Cannot exclude infection/cystitis.  Recommend clinical correlation.  Appendix is visualized and is normal.  Large and small bowel grossly unremarkable.  Trace free fluid in the pelvis.  Aorta is normal caliber.  No acute bony abnormality.  IMPRESSION: Left nephrolithiasis.  Slight bladder wall thickening, question cystitis.  Recommend clinical correlation.   Original Report Authenticated By: Charlett Nose, M.D.   Ct Abdomen Pelvis W Contrast  11/20/2012   *RADIOLOGY REPORT*  Clinical  Data: Left lower quadrant pain.  Nausea diarrhea.  History of diverticulitis with colon resection.  CT ABDOMEN AND PELVIS WITH CONTRAST  Technique:  Multidetector CT imaging of the abdomen and pelvis was performed following the standard protocol during bolus administration of intravenous contrast.  Contrast: OMNIPAQUE IOHEXOL 300 MG/ML  SOLN  Comparison: [place] [facility] at [address] CT abdomen and pelvis exam from 12/28/2008  Findings: Lungs are clear bilaterally.  No focal abnormalities seen in the liver or spleen.  Moderate hiatal hernia is noted.  Surgical clips are seen in the region of the esophagogastric junction and the patient is status post hiatal hernia repair.  The duodenum, pancreas, and adrenal glands are unremarkable.  Gallbladder is decompressed.  Right kidney is unremarkable.  2 mm nonobstructing stone is identified in the interpolar left kidney.  No abdominal aortic aneurysm.  No free fluid or lymphadenopathy in the abdomen.  Imaging through the pelvis shows no free intraperitoneal fluid. There is no pelvic sidewall lymphadenopathy.  Bladder is unremarkable.  Diverticular changes are seen in the left colon and there is some edema/inflammation adjacent to the mid descending segment.  No extraluminal gas or pericolonic abscess at this time. The terminal ileum is normal.  The appendix is normal.  Bone windows reveal no worrisome lytic or sclerotic osseous lesions.  IMPRESSION: Wall thickening with pericolonic edema/inflammation of the mid descending colon is associated with diverticular change.  Imaging features are most suggestive of diverticulitis without perforation or abscess.  As colonic neoplasm  can present with similar imaging features, close follow-up is suggested.  Moderate recurrent hiatal hernia.   Original Report Authenticated By: Kennith Center, M.D.   Dg Chest Port 1 View  12/13/2012   *RADIOLOGY REPORT*  Clinical Data: Chest pain and, hypertension.  PORTABLE CHEST - 1 VIEW   Comparison: 07/22/2012  Findings: Cardiomegaly.  No focal airspace opacities or effusions. No acute bony abnormality.  IMPRESSION: Cardiomegaly.  No acute findings.   Original Report Authenticated By: Charlett Nose, M.D.         Subjective: Patient denies fevers, chills, chest pain, short of breath, nausea, vomiting, diarrhea, abdominal pain, dysuria. No headaches or dizziness  Objective: Filed Vitals:   12/17/12 2000 12/18/12 0000 12/18/12 0450 12/18/12 0752  BP: 172/89 131/77 137/83 155/96  Pulse: 97 78 68 73  Temp: 99.1 F (37.3 C) 99.1 F (37.3 C) 98 F (36.7 C) 98.3 F (36.8 C)  TempSrc: Oral Oral Oral Oral  Resp: 18 18 17    Height:      Weight:      SpO2: 99% 98% 98% 96%    Intake/Output Summary (Last 24 hours) at 12/18/12 0820 Last data filed at 12/17/12 1900  Gross per 24 hour  Intake    360 ml  Output      0 ml  Net    360 ml   Weight change:  Exam:   General:  Pt is alert, follows commands appropriately, not in acute distress  HEENT: No icterus, No thrush,[place]/AT  Cardiovascular: RRR, S1/S2, no rubs, no gallops  Respiratory: CTA bilaterally, no wheezing, no crackles, no rhonchi  Abdomen: Soft/+BS, non tender, non distended, no guarding  Extremities: No edema, No lymphangitis, No petechiae, No rashes, no synovitis  Data Reviewed: Basic Metabolic Panel:  Recent Labs Lab 12/13/12 2200 12/13/12 2354 12/14/12 0450 12/16/12 0540 12/16/12 1328  NA 138  --  141 138 142  K 3.8  --  3.6 4.4 3.7  CL 102  --  106 106 105  CO2 28  --  28 21 26   GLUCOSE 112*  --  122* 94 90  BUN 11  --  12 9 8   CREATININE 0.90  --  0.86 0.71 0.92  CALCIUM 9.0  --  8.5 8.5 9.0  MG  --  2.0  --   --   --    Liver Function Tests:  Recent Labs Lab 12/16/12 0540 12/16/12 1328  AST 17 17  ALT 12 13  ALKPHOS 43 42  BILITOT 0.3 0.2*  PROT 6.1 6.3  ALBUMIN 2.7* 2.6*   No results found for this basename: LIPASE, AMYLASE,  in the last 168 hours No results found for  this basename: AMMONIA,  in the last 168 hours CBC:  Recent Labs Lab 12/15/12 0535 12/16/12 0540 12/16/12 1328 12/17/12 0803 12/18/12 0335  WBC 18.2* 13.8* 12.5* 11.5* 11.1*  NEUTROABS  --   --  8.2*  --   --   HGB 12.1* 12.6* 11.8* 11.9* 11.2*  HCT 35.8* 35.9* 34.8* 35.0* 33.2*  MCV 95.5 93.7 95.6 95.4 94.6  PLT 225 188 241 259 249   Cardiac Enzymes:  Recent Labs Lab 12/15/12 1830 12/16/12 1328  TROPONINI 7.24* 4.36*   BNP: No components found with this basename: POCBNP,  CBG: No results found for this basename: GLUCAP,  in the last 168 hours  Recent Results (from the past 240 hour(s))  CULTURE, BLOOD (ROUTINE X 2)     Status: None   Collection Time  12/15/12  6:40 PM      Result Value Range Status   Specimen Description BLOOD ARM LEFT   Final   Special Requests BOTTLES DRAWN AEROBIC AND ANAEROBIC 10CC   Final   Culture  Setup Time 12/16/2012 04:38   Final   Culture     Final   Value:        BLOOD CULTURE RECEIVED NO GROWTH TO DATE CULTURE WILL BE HELD FOR 5 DAYS BEFORE ISSUING A FINAL NEGATIVE REPORT   Report Status PENDING   Incomplete  CULTURE, BLOOD (ROUTINE X 2)     Status: None   Collection Time    12/15/12  7:00 PM      Result Value Range Status   Specimen Description BLOOD HAND LEFT   Final   Special Requests BOTTLES DRAWN AEROBIC AND ANAEROBIC 10CC   Final   Culture  Setup Time 12/16/2012 04:39   Final   Culture     Final   Value:        BLOOD CULTURE RECEIVED NO GROWTH TO DATE CULTURE WILL BE HELD FOR 5 DAYS BEFORE ISSUING A FINAL NEGATIVE REPORT   Report Status PENDING   Incomplete     Scheduled Meds: . aspirin EC  81 mg Oral Daily  . clopidogrel  75 mg Oral Q breakfast  . nebivolol  5 mg Oral q morning - 10a  . nicotine  14 mg Transdermal Daily  . pantoprazole  40 mg Oral Daily  . simvastatin  40 mg Oral QHS   Continuous Infusions:    Gudrun Axe, DO  [facility] Pager [phone]  If 7PM-7AM, please contact  night-coverage [email or web address] Password [email or web address] 12/18/2012, 8:20 AM   LOS: 5 days

## 2012-12-18 NOTE — Discharge Summary (Addendum)
Patient ID: Brett Davis MRN: [id number] DOB/AGE: [birth year] 47 y.o.  Admit date: 12/13/2012 Discharge date: 12/18/2012  Primary Discharge Diagnosis: NSTEMI Secondary Discharge Diagnosis: CAD, tobacco use, anxiety/depression, hypertension, hyperlipidemia, psoriatic arthritis  Significant Diagnostic Studies:   Cardiac catheterization 12/17/12: -Occluded AV groove circumflex (third obtuse marginal part of this distribution) -Ejection fraction 50% with base to mid inferior wall hypokinesis/akinesis -Patent RCA stent, bare-metal placed in 2009 -Obtuse marginal 1 with 50% hazy stenosis. -Medical management, completed infarct  Echocardiogram 12/14/12: -Ejection fraction 55% with base to mid posterior wall akinesis  EKG's: No ST segment changes    Hospital Course: 47 year old male with heavy tobacco use, prior MI, prior RCA, BMS stent who presented to the hospital with subjective fever and chest pain. For proximally 2-3 weeks prior to admission he had an upper respiratory infection, cough, felt poorly, occasional diaphoresis, occasional night sweats. Occasional yellow sputum production. Prior to admission he was participating in activities such as taking his fence down and had periods of diaphoresis, pallor. 2 days prior to admission he had quite intense chest discomfort/neck discomfort radiating to his jaw associated with shortness of breath, diaphoresis, nausea but no vomiting. The chest pain lasted a total of approximately 20 minutes before relieving spontaneously.  Emergency room point-of-care troponin was 20, BNP was 1212. Hemodynamically stable.  In the emergency room/early into admission, he had a fever 101. Night sweats. Blood cultures were drawn and thus far are negative. Chest x-ray showed no evidence of pneumonia. UA negative. Hospitalist consultation. Appreciate their assistance. Presumed diagnosis of paracardial inflammation/myopericarditis. Ibuprofen was administered. White  blood cell count decreased from 19.  After he remained afebrile. Echocardiogram did demonstrate focal wall akinesis along the base to mid inferior posterior region. He was taken to cardiac catheterization lab, radial approach as above. Occluded AV groove circumflex. Culprit vessel. Given the timing of his infarct/completed infarct, CTO, continue with medical management. He showed no evidence of dangerous or adverse arrhythmias on telemetry. His echocardiogram did not show any evidence of significant thinning of his ventricle. Both he and his significant other were counseled on risks involved with myocardial infarction including sudden death, myocardial rupture.  His fevers seen on admission, night sweats are natural course of completed infarction and his troponin decreased accordingly.  Because of non-ST elevation myocardial infarction, I have added Plavix which we can continue for at least one month but perhaps for one year. Continue with aspirin. I increased his statin to 40 mg, simvastatin. Tobacco cessation.   Each day I saw him, they requested nicotinic patch. He works as a Curator. I'm comfortable with his discharge today. He is ambulating well. Feeling good. I will keep him out of work and we'll reassess at followup.   Discharge Exam: Blood pressure 137/83, pulse 68, temperature 98 F (36.7 C), temperature source Oral, resp. rate 17, height 5\' 11"  (1.803 m), weight 83.915 kg (185 lb), SpO2 98.00%.    General: Alert and oriented x3 no acute distress Cardiovascular: Regular rate and rhythm, no rub, no murmur, no JVD Lungs: Minor inspiratory sounds heard bilateral posterior aspects of lung, no active wheezing Abdomen: Soft, nontender positive bowel sounds Extremities: No clubbing cyanosis or edema, radial artery catheterization site clean dry and intact.  Labs:   Lab Results  Component Value Date   WBC 11.1* 12/18/2012   HGB 11.2* 12/18/2012   HCT 33.2* 12/18/2012   MCV 94.6 12/18/2012    PLT 249 12/18/2012    Recent Labs Lab 12/16/12 1328  NA 142  K 3.7  CL 105  CO2 26  BUN 8  CREATININE 0.92  CALCIUM 9.0  PROT 6.3  BILITOT 0.2*  ALKPHOS 42  ALT 13  AST 17  GLUCOSE 90   Lab Results  Component Value Date   CKTOTAL 292* 07/02/2011   CKMB 3.1 07/02/2011   TROPONINI 4.36* 12/16/2012    Lab Results  Component Value Date   CHOL 111 12/14/2012   CHOL 137 07/22/2012   CHOL  Value: 180 (NOTE) ATP III Classification:      < 200        mg/dL        Desirable     213 - 239     mg/dL        Borderline High     >= 240        mg/dL        High  0/86/5784   Lab Results  Component Value Date   HDL 32* 12/14/2012   HDL 30* 07/22/2012   HDL 24* 02/15/2010   Lab Results  Component Value Date   LDLCALC 47 12/14/2012   LDLCALC 83 07/22/2012   LDLCALC  Value: 113 (NOTE)  Total Cholesterol/HDL Ratio:CHD Risk                       Coronary Heart Disease Risk Table                                       Men       Women         1/2 Average Risk              3.4        3.3             Average Risk              5.0         4.4         2 X Average Risk              9.6        7.1         3 X Average Risk             23.4       11.0 Use the calculated Patient Ratio above and the CHD Risk table  to determine the patient's CHD Risk. ATP III Classification (LDL):      < 100         mg/dL         Optimal     696 - 129     mg/dL         Near or Above Optimal     130 - 159     mg/dL         Borderline High     160 - 189     mg/dL         High      > 295        mg/dL         Very High * 2/84/1324   Lab Results  Component Value Date   TRIG 161* 12/14/2012   TRIG 118 07/22/2012   TRIG 214* 02/15/2010   Lab Results  Component Value Date   CHOLHDL 3.5 12/14/2012   CHOLHDL 4.6 07/22/2012  CHOLHDL 7.5 02/15/2010   No results found for this basename: LDLDIRECT    FOLLOW UP PLANS AND APPOINTMENTS Discharge Orders   Future Orders Complete By Expires     Diet - low sodium heart healthy  As  directed     Increase activity slowly  As directed         Medication List    STOP taking these medications       aspirin 325 MG tablet      TAKE these medications       ADVIL 200 MG tablet  Generic drug:  ibuprofen  Take 400-600 mg by mouth daily as needed for pain. For pain     ALPRAZolam 1 MG tablet  Commonly known as:  XANAX  Take 0.5-1 mg by mouth 3 (three) times daily as needed for anxiety. For anxiety     aspirin 81 MG EC tablet  Take 1 tablet (81 mg total) by mouth daily.     clopidogrel 75 MG tablet  Commonly known as:  PLAVIX  Take 1 tablet (75 mg total) by mouth daily with breakfast.     desvenlafaxine 100 MG 24 hr tablet  Commonly known as:  PRISTIQ  Take 100 mg by mouth daily.     HYDROcodone-acetaminophen 5-500 MG per tablet  Commonly known as:  VICODIN  Take 1 tablet by mouth every 6 (six) hours as needed. For pain     methotrexate 25 MG/ML Soln  Inject 25 mg as directed every 7 (seven) days. every Sat for arthritis     nebivolol 5 MG tablet  Commonly known as:  BYSTOLIC  Take 5 mg by mouth every morning.     nicotine 14 mg/24hr patch  Commonly known as:  NICODERM CQ - dosed in mg/24 hours  Place 1 patch onto the skin daily.     NITROSTAT 0.4 MG SL tablet  Generic drug:  nitroGLYCERIN  Place 0.4 mg under the tongue every 5 (five) minutes as needed. For chest pain     nitroGLYCERIN 0.4 MG SL tablet  Commonly known as:  NITROSTAT  Place 1 tablet (0.4 mg total) under the tongue every 5 (five) minutes x 3 doses as needed for chest pain.     simvastatin 40 MG tablet  Commonly known as:  ZOCOR  Take 1 tablet (40 mg total) by mouth at bedtime.           Follow-up Information   Follow up with FERGUSON,CYNTHIA A, NP On 12/25/2012. (10:30am)    Contact information:   [facility] [address] [place], [address] [place] [address] [phone]      We will have cardiac rehabilitation see him. BRING ALL MEDICATIONS  WITH YOU TO FOLLOW UP APPOINTMENTS  Time spent with patient to include physician time: , medication reconciliation, several questions answered, review of medical records. SignedDonato Schultz 12/18/2012, 7:42 AM

## 2012-12-18 NOTE — Progress Notes (Signed)
CARDIAC REHAB PHASE I   PRE:  Rate/Rhythm: 78 SR  BP:  Supine:   Sitting: 155/96  Standing:    SaO2:   MODE:  Ambulation: 1000 ft   POST:  Rate/Rhythm: 92 SR  BP:  Supine:   Sitting: 165/94  Standing:    SaO2:  0830-0920 Pt tolerated ambulation well without c/po of cp or SOB. BP up before and after walk. Completed MI education with pt. He voices understanding. Discussed smoking cessation with him. Gave pt tips for quitting and coaching contact number. Pt seems committed to quitting using patch. Gave emotional support and encouragement. He understands that he needs to make life style modifications.  Melina Copa RN 12/18/2012 9:32 AM

## 2012-12-22 LAB — CULTURE, BLOOD (ROUTINE X 2): Culture: NO GROWTH

## 2013-06-14 ENCOUNTER — Telehealth: Payer: Self-pay | Admitting: *Deleted

## 2013-06-14 NOTE — Telephone Encounter (Signed)
Phone call received from the patient's dental office. The patient is there for a tooth extraction. They are requiring if pre-medication is needed. No pre-med required.

## 2013-06-28 ENCOUNTER — Other Ambulatory Visit: Payer: Self-pay | Admitting: *Deleted

## 2013-06-28 DIAGNOSIS — E782 Mixed hyperlipidemia: Secondary | ICD-10-CM

## 2013-07-21 ENCOUNTER — Ambulatory Visit: Payer: BC Managed Care – PPO | Admitting: Cardiology

## 2013-07-23 ENCOUNTER — Other Ambulatory Visit: Payer: BC Managed Care – PPO

## 2013-07-30 ENCOUNTER — Encounter: Payer: Self-pay | Admitting: Cardiology

## 2014-04-11 ENCOUNTER — Encounter (HOSPITAL_COMMUNITY): Payer: Self-pay | Admitting: Emergency Medicine

## 2014-04-11 ENCOUNTER — Emergency Department (HOSPITAL_COMMUNITY)
Admission: EM | Admit: 2014-04-11 | Discharge: 2014-04-11 | Disposition: A | Discharge status: Home / Self Care | Payer: Worker's Compensation | Attending: Emergency Medicine | Admitting: Emergency Medicine

## 2014-04-11 DIAGNOSIS — Y990 Civilian activity done for income or pay: Secondary | ICD-10-CM | POA: Insufficient documentation

## 2014-04-11 DIAGNOSIS — Z7982 Long term (current) use of aspirin: Secondary | ICD-10-CM | POA: Insufficient documentation

## 2014-04-11 DIAGNOSIS — T2220XA Burn of second degree of shoulder and upper limb, except wrist and hand, unspecified site, initial encounter: Secondary | ICD-10-CM | POA: Diagnosis not present

## 2014-04-11 DIAGNOSIS — I252 Old myocardial infarction: Secondary | ICD-10-CM | POA: Diagnosis not present

## 2014-04-11 DIAGNOSIS — F17200 Nicotine dependence, unspecified, uncomplicated: Secondary | ICD-10-CM | POA: Insufficient documentation

## 2014-04-11 DIAGNOSIS — I10 Essential (primary) hypertension: Secondary | ICD-10-CM | POA: Insufficient documentation

## 2014-04-11 DIAGNOSIS — E785 Hyperlipidemia, unspecified: Secondary | ICD-10-CM | POA: Diagnosis not present

## 2014-04-11 DIAGNOSIS — X19XXXA Contact with other heat and hot substances, initial encounter: Secondary | ICD-10-CM | POA: Diagnosis not present

## 2014-04-11 DIAGNOSIS — M129 Arthropathy, unspecified: Secondary | ICD-10-CM | POA: Diagnosis not present

## 2014-04-11 DIAGNOSIS — Z8719 Personal history of other diseases of the digestive system: Secondary | ICD-10-CM | POA: Insufficient documentation

## 2014-04-11 DIAGNOSIS — T310 Burns involving less than 10% of body surface: Secondary | ICD-10-CM

## 2014-04-11 DIAGNOSIS — Y9389 Activity, other specified: Secondary | ICD-10-CM | POA: Insufficient documentation

## 2014-04-11 DIAGNOSIS — Z79899 Other long term (current) drug therapy: Secondary | ICD-10-CM | POA: Diagnosis not present

## 2014-04-11 DIAGNOSIS — Y9289 Other specified places as the place of occurrence of the external cause: Secondary | ICD-10-CM | POA: Insufficient documentation

## 2014-04-11 MED ORDER — SILVER SULFADIAZINE 1 % EX CREA: 1.0000 "application " | TOPICAL_CREAM | Freq: Every day | CUTANEOUS | Status: DC
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
## 2014-05-05 ENCOUNTER — Encounter: Payer: Self-pay | Admitting: General Surgery

## 2014-05-05 DIAGNOSIS — I10 Essential (primary) hypertension: Secondary | ICD-10-CM | POA: Insufficient documentation

## 2014-05-09 ENCOUNTER — Emergency Department (HOSPITAL_COMMUNITY): Payer: Worker's Compensation

## 2014-05-09 ENCOUNTER — Emergency Department (HOSPITAL_COMMUNITY): Payer: Worker's Compensation | Admitting: Anesthesiology

## 2014-05-09 ENCOUNTER — Inpatient Hospital Stay (HOSPITAL_COMMUNITY)
Admission: EM | Admit: 2014-05-09 | Discharge: 2014-05-11 | DRG: 514 | Disposition: A | Discharge status: Home / Self Care | Payer: Worker's Compensation | Attending: Internal Medicine | Admitting: Internal Medicine

## 2014-05-09 ENCOUNTER — Encounter (HOSPITAL_COMMUNITY)
Admission: EM | Disposition: A | Discharge status: Home / Self Care | Payer: Self-pay | Source: Home / Self Care | Attending: Internal Medicine

## 2014-05-09 ENCOUNTER — Encounter (HOSPITAL_COMMUNITY): Payer: Self-pay | Admitting: Emergency Medicine

## 2014-05-09 ENCOUNTER — Encounter (HOSPITAL_COMMUNITY): Payer: Worker's Compensation | Admitting: Anesthesiology

## 2014-05-09 DIAGNOSIS — M65842 Other synovitis and tenosynovitis, left hand: Principal | ICD-10-CM | POA: Diagnosis present

## 2014-05-09 DIAGNOSIS — I252 Old myocardial infarction: Secondary | ICD-10-CM

## 2014-05-09 DIAGNOSIS — Z86010 Personal history of colonic polyps: Secondary | ICD-10-CM

## 2014-05-09 DIAGNOSIS — Z8249 Family history of ischemic heart disease and other diseases of the circulatory system: Secondary | ICD-10-CM

## 2014-05-09 DIAGNOSIS — Z7982 Long term (current) use of aspirin: Secondary | ICD-10-CM

## 2014-05-09 DIAGNOSIS — M6588 Other synovitis and tenosynovitis, other site: Secondary | ICD-10-CM

## 2014-05-09 DIAGNOSIS — Y939 Activity, unspecified: Secondary | ICD-10-CM

## 2014-05-09 DIAGNOSIS — F419 Anxiety disorder, unspecified: Secondary | ICD-10-CM | POA: Diagnosis present

## 2014-05-09 DIAGNOSIS — F42 Obsessive-compulsive disorder: Secondary | ICD-10-CM | POA: Diagnosis present

## 2014-05-09 DIAGNOSIS — R51 Headache: Secondary | ICD-10-CM | POA: Diagnosis present

## 2014-05-09 DIAGNOSIS — Z720 Tobacco use: Secondary | ICD-10-CM

## 2014-05-09 DIAGNOSIS — I2510 Atherosclerotic heart disease of native coronary artery without angina pectoris: Secondary | ICD-10-CM | POA: Diagnosis present

## 2014-05-09 DIAGNOSIS — M659 Synovitis and tenosynovitis, unspecified: Secondary | ICD-10-CM

## 2014-05-09 DIAGNOSIS — M069 Rheumatoid arthritis, unspecified: Secondary | ICD-10-CM | POA: Diagnosis present

## 2014-05-09 DIAGNOSIS — K449 Diaphragmatic hernia without obstruction or gangrene: Secondary | ICD-10-CM | POA: Diagnosis present

## 2014-05-09 DIAGNOSIS — K219 Gastro-esophageal reflux disease without esophagitis: Secondary | ICD-10-CM | POA: Diagnosis present

## 2014-05-09 DIAGNOSIS — E785 Hyperlipidemia, unspecified: Secondary | ICD-10-CM

## 2014-05-09 DIAGNOSIS — Z79899 Other long term (current) drug therapy: Secondary | ICD-10-CM

## 2014-05-09 DIAGNOSIS — F329 Major depressive disorder, single episode, unspecified: Secondary | ICD-10-CM | POA: Diagnosis present

## 2014-05-09 DIAGNOSIS — X17XXXA Contact with hot engines, machinery and tools, initial encounter: Secondary | ICD-10-CM | POA: Diagnosis present

## 2014-05-09 DIAGNOSIS — Z885 Allergy status to narcotic agent status: Secondary | ICD-10-CM

## 2014-05-09 DIAGNOSIS — N529 Male erectile dysfunction, unspecified: Secondary | ICD-10-CM | POA: Diagnosis present

## 2014-05-09 DIAGNOSIS — T23122A Burn of first degree of single left finger (nail) except thumb, initial encounter: Secondary | ICD-10-CM | POA: Diagnosis present

## 2014-05-09 DIAGNOSIS — I10 Essential (primary) hypertension: Secondary | ICD-10-CM | POA: Diagnosis present

## 2014-05-09 HISTORY — PX: I & D EXTREMITY: SHX5045

## 2014-05-09 LAB — COMPREHENSIVE METABOLIC PANEL
ALT: 7 U/L (ref 0–53)
AST: 11 U/L (ref 0–37)
Albumin: 3.9 g/dL (ref 3.5–5.2)
Alkaline Phosphatase: 41 U/L (ref 39–117)
Anion gap: 13 (ref 5–15)
BILIRUBIN TOTAL: 0.2 mg/dL — AB (ref 0.3–1.2)
BUN: 13 mg/dL (ref 6–23)
CHLORIDE: 100 meq/L (ref 96–112)
CO2: 25 mEq/L (ref 19–32)
Calcium: 8.9 mg/dL (ref 8.4–10.5)
Creatinine, Ser: 1.04 mg/dL (ref 0.50–1.35)
GFR, EST NON AFRICAN AMERICAN: 83 mL/min — AB (ref >90)
GLUCOSE: 99 mg/dL (ref 70–99)
POTASSIUM: 3.8 meq/L (ref 3.7–5.3)
Sodium: 138 mEq/L (ref 137–147)
Total Protein: 6.5 g/dL (ref 6.0–8.3)

## 2014-05-09 LAB — CBC WITH DIFFERENTIAL/PLATELET
Basophils Absolute: 0.1 10*3/uL (ref 0.0–0.1)
Basophils Relative: 0 % (ref 0–1)
EOS ABS: 0.1 10*3/uL (ref 0.0–0.7)
Eosinophils Relative: 1 % (ref 0–5)
HCT: 37.7 % — ABNORMAL LOW (ref 39.0–52.0)
Hemoglobin: 13.4 g/dL (ref 13.0–17.0)
LYMPHS ABS: 3.1 10*3/uL (ref 0.7–4.0)
Lymphocytes Relative: 22 % (ref 12–46)
MCH: 34.5 pg — AB (ref 26.0–34.0)
MCHC: 35.5 g/dL (ref 30.0–36.0)
MCV: 97.2 fL (ref 78.0–100.0)
Monocytes Absolute: 1.8 10*3/uL — ABNORMAL HIGH (ref 0.1–1.0)
Monocytes Relative: 13 % — ABNORMAL HIGH (ref 3–12)
NEUTROS ABS: 8.7 10*3/uL — AB (ref 1.7–7.7)
NEUTROS PCT: 64 % (ref 43–77)
Platelets: 196 10*3/uL (ref 150–400)
RBC: 3.88 MIL/uL — ABNORMAL LOW (ref 4.22–5.81)
RDW: 12.6 % (ref 11.5–15.5)
WBC: 13.7 10*3/uL — ABNORMAL HIGH (ref 4.0–10.5)

## 2014-05-09 SURGERY — IRRIGATION AND DEBRIDEMENT EXTREMITY
Anesthesia: General | Site: Finger | Laterality: Left

## 2014-05-09 MED ORDER — FENTANYL CITRATE 0.05 MG/ML IJ SOLN
INTRAMUSCULAR | Status: AC
  Filled 2014-05-09: qty 2

## 2014-05-09 MED ORDER — HYDROMORPHONE HCL 1 MG/ML IJ SOLN
1.0000 mg | Freq: Once | INTRAMUSCULAR | Status: AC
  Administered 2014-05-09: 1 mg via INTRAVENOUS
  Filled 2014-05-09: qty 1

## 2014-05-09 MED ORDER — LACTATED RINGERS IV SOLN
INTRAVENOUS | Status: DC | PRN
  Administered 2014-05-09: via INTRAVENOUS

## 2014-05-09 MED ORDER — ACETAMINOPHEN 10 MG/ML IV SOLN
1000.0000 mg | Freq: Once | INTRAVENOUS | Status: AC
  Administered 2014-05-09: 1000 mg via INTRAVENOUS
  Filled 2014-05-09: qty 100

## 2014-05-09 MED ORDER — PROPOFOL 10 MG/ML IV BOLUS
INTRAVENOUS | Status: AC
  Filled 2014-05-09: qty 20

## 2014-05-09 MED ORDER — CLINDAMYCIN PHOSPHATE 600 MG/50ML IV SOLN
600.0000 mg | Freq: Once | INTRAVENOUS | Status: AC
  Administered 2014-05-09: 600 mg via INTRAVENOUS
  Filled 2014-05-09: qty 50

## 2014-05-09 MED ORDER — MORPHINE SULFATE 4 MG/ML IJ SOLN
4.0000 mg | Freq: Once | INTRAMUSCULAR | Status: AC
  Administered 2014-05-09: 4 mg via INTRAVENOUS
  Filled 2014-05-09: qty 1

## 2014-05-09 MED ORDER — LIDOCAINE HCL (CARDIAC) 20 MG/ML IV SOLN
INTRAVENOUS | Status: AC
  Filled 2014-05-09: qty 5

## 2014-05-09 SURGICAL SUPPLY — 43 items
BAG SPEC THK2 15X12 ZIP CLS (MISCELLANEOUS)
BAG ZIPLOCK 12X15 (MISCELLANEOUS) IMPLANT
BANDAGE ELASTIC 4 VELCRO ST LF (GAUZE/BANDAGES/DRESSINGS) IMPLANT
BANDAGE ESMARK 6X9 LF (GAUZE/BANDAGES/DRESSINGS) IMPLANT
BNDG CMPR 9X4 STRL LF SNTH (GAUZE/BANDAGES/DRESSINGS) ×1
BNDG CMPR 9X6 STRL LF SNTH (GAUZE/BANDAGES/DRESSINGS)
BNDG COHESIVE 1X5 TAN STRL LF (GAUZE/BANDAGES/DRESSINGS) ×3 IMPLANT
BNDG CONFORM 2 STRL LF (GAUZE/BANDAGES/DRESSINGS) ×3 IMPLANT
BNDG ESMARK 4X9 LF (GAUZE/BANDAGES/DRESSINGS) ×3 IMPLANT
BNDG ESMARK 6X9 LF (GAUZE/BANDAGES/DRESSINGS)
BNDG GAUZE ELAST 4 BULKY (GAUZE/BANDAGES/DRESSINGS) IMPLANT
CANISTER SUCTION 2500CC (MISCELLANEOUS) IMPLANT
CORDS BIPOLAR (ELECTRODE) ×3 IMPLANT
CUFF TOURN SGL QUICK 18 (TOURNIQUET CUFF) ×3 IMPLANT
CUFF TOURN SGL QUICK 24 (TOURNIQUET CUFF)
CUFF TRNQT CYL 24X4X40X1 (TOURNIQUET CUFF) IMPLANT
DRAIN PENROSE 18X1/2 LTX STRL (DRAIN) IMPLANT
DRSG PAD ABDOMINAL 8X10 ST (GAUZE/BANDAGES/DRESSINGS) IMPLANT
ELECT REM PT RETURN 9FT ADLT (ELECTROSURGICAL) ×3
ELECTRODE REM PT RTRN 9FT ADLT (ELECTROSURGICAL) ×1 IMPLANT
EVACUATOR 1/8 PVC DRAIN (DRAIN) IMPLANT
GAUZE IODOFORM PACK 1/2 7832 (GAUZE/BANDAGES/DRESSINGS) IMPLANT
GAUZE PACKING IODOFORM 1/4X15 (GAUZE/BANDAGES/DRESSINGS) IMPLANT
GAUZE SPONGE 4X4 12PLY STRL (GAUZE/BANDAGES/DRESSINGS) ×3 IMPLANT
GAUZE XEROFORM 1X8 LF (GAUZE/BANDAGES/DRESSINGS) ×3 IMPLANT
GAUZE XEROFORM 5X9 LF (GAUZE/BANDAGES/DRESSINGS) IMPLANT
GOWN STRL REUS W/TWL LRG LVL3 (GOWN DISPOSABLE) ×3 IMPLANT
HANDPIECE INTERPULSE COAX TIP (DISPOSABLE)
IV NS IRRIG 3000ML ARTHROMATIC (IV SOLUTION) IMPLANT
KIT BASIN OR (CUSTOM PROCEDURE TRAY) ×3 IMPLANT
PACK ORTHO EXTREMITY (CUSTOM PROCEDURE TRAY) ×3 IMPLANT
PAD CAST 4YDX4 CTTN HI CHSV (CAST SUPPLIES) IMPLANT
PADDING CAST COTTON 4X4 STRL (CAST SUPPLIES)
POSITIONER SURGICAL ARM (MISCELLANEOUS) IMPLANT
SCOTCHCAST PLUS 4X4 WHITE (CAST SUPPLIES) ×3 IMPLANT
SET CYSTO W/LG BORE CLAMP LF (SET/KITS/TRAYS/PACK) ×3 IMPLANT
SET HNDPC FAN SPRY TIP SCT (DISPOSABLE) IMPLANT
SUT ETHILON 4 0 PS 2 18 (SUTURE) ×3 IMPLANT
SWAB COLLECTION DEVICE MRSA (MISCELLANEOUS) ×6 IMPLANT
SYR CONTROL 10ML LL (SYRINGE) ×3 IMPLANT
TOWEL OR 17X26 10 PK STRL BLUE (TOWEL DISPOSABLE) ×3 IMPLANT
TUBE ANAEROBIC SPECIMEN COL (MISCELLANEOUS) ×6 IMPLANT
TUBE FEEDING 5FR 36IN KANGAROO (TUBING) ×3 IMPLANT

## 2014-05-09 NOTE — Anesthesia Preprocedure Evaluation (Signed)
Anesthesia Evaluation  Patient identified by MRN, date of birth, ID band Patient awake    Reviewed: Allergy & Precautions, H&P , NPO status , Patient's Chart, lab work & pertinent test results, reviewed documented beta blocker date and time   Airway Mallampati: II TM Distance: >3 FB Neck ROM: full    Dental no notable dental hx. (+) Teeth Intact, Dental Advisory Given   Pulmonary Current Smoker,  breath sounds clear to auscultation  Pulmonary exam normal       Cardiovascular Exercise Tolerance: Good hypertension, Pt. on home beta blockers + CAD and + Past MI Rhythm:regular Rate:Normal     Neuro/Psych negative neurological ROS  negative psych ROS   GI/Hepatic negative GI ROS, Neg liver ROS, hiatal hernia, GERD-  ,  Endo/Other  negative endocrine ROS  Renal/GU negative Renal ROS  negative genitourinary   Musculoskeletal   Abdominal   Peds  Hematology negative hematology ROS (+)   Anesthesia Other Findings   Reproductive/Obstetrics negative OB ROS                           Anesthesia Physical Anesthesia Plan  ASA: III and emergent  Anesthesia Plan: General   Post-op Pain Management:    Induction: Intravenous, Rapid sequence and Cricoid pressure planned  Airway Management Planned: Oral ETT  Additional Equipment:   Intra-op Plan:   Post-operative Plan: Extubation in OR  Informed Consent: I have reviewed the patients History and Physical, chart, labs and discussed the procedure including the risks, benefits and alternatives for the proposed anesthesia with the patient or authorized representative who has indicated his/her understanding and acceptance.   Dental Advisory Given  Plan Discussed with: CRNA and Surgeon  Anesthesia Plan Comments:         Anesthesia Quick Evaluation
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
## 2014-05-09 NOTE — Consult Note (Signed)
Reason for Consult:left index pain and swelling Referring Physician: [name] is an 48 y.o. male.  HPI: with h/o left index pain and swelling times 24 hours  Past Medical History  Diagnosis Date  . Hypertension   . GERD (gastroesophageal reflux disease)   . Hiatal hernia   . Diverticulitis   . Hyperlipidemia   . History of endoscopy   . Abdominal pain   . Hiatal hernia   . Arthritis   . Generalized headaches   . Dizziness - light-headed   . NSTEMI (non-ST elevated myocardial infarction) 12/13/12    Occluded circ, patent RCA BMS   . Tobacco use disorder   . OCD (obsessive compulsive disorder)     with Depression and anxiety  . ED (erectile dysfunction)   . Colon polyps     Past Surgical History  Procedure Laterality Date  . Colon surgery      sigmoid colectomy for diverticulitis  . Upper endoscopy w/ esophageal manometry      Family History  Problem Relation Age of Onset  . Coronary artery disease    . Hypertension    . Diabetes type II      Social History:  reports that he has been smoking.  He has never used smokeless tobacco. He reports that he does not drink alcohol or use illicit drugs.  Allergies:  Allergies  Allergen Reactions  . Codeine Nausea Only    Medications: Prior to Admission:  (Not in a hospital admission)  Results for orders placed during the hospital encounter of 05/09/14 (from the past 48 hour(s))  CBC WITH DIFFERENTIAL     Status: Abnormal   Collection Time    05/09/14  9:07 PM      Result Value Ref Range   WBC 13.7 (*) 4.0 - 10.5 K/uL   RBC 3.88 (*) 4.22 - 5.81 MIL/uL   Hemoglobin 13.4  13.0 - 17.0 g/dL   HCT 37.7 (*) 39.0 - 52.0 %   MCV 97.2  78.0 - 100.0 fL   MCH 34.5 (*) 26.0 - 34.0 pg   MCHC 35.5  30.0 - 36.0 g/dL   RDW 12.6  11.5 - 15.5 %   Platelets 196  150 - 400 K/uL   Neutrophils Relative % 64  43 - 77 %   Neutro Abs 8.7 (*) 1.7 - 7.7 K/uL   Lymphocytes Relative 22  12 - 46 %   Lymphs Abs 3.1  0.7 - 4.0  K/uL   Monocytes Relative 13 (*) 3 - 12 %   Monocytes Absolute 1.8 (*) 0.1 - 1.0 K/uL   Eosinophils Relative 1  0 - 5 %   Eosinophils Absolute 0.1  0.0 - 0.7 K/uL   Basophils Relative 0  0 - 1 %   Basophils Absolute 0.1  0.0 - 0.1 K/uL  COMPREHENSIVE METABOLIC PANEL     Status: Abnormal   Collection Time    05/09/14  9:07 PM      Result Value Ref Range   Sodium 138  137 - 147 mEq/L   Potassium 3.8  3.7 - 5.3 mEq/L   Chloride 100  96 - 112 mEq/L   CO2 25  19 - 32 mEq/L   Glucose, Bld 99  70 - 99 mg/dL   BUN 13  6 - 23 mg/dL   Creatinine, Ser 1.04  0.50 - 1.35 mg/dL   Calcium 8.9  8.4 - 10.5 mg/dL   Total Protein 6.5  6.0 -  8.3 g/dL   Albumin 3.9  3.5 - 5.2 g/dL   AST 11  0 - 37 U/L   Comment: SLIGHT HEMOLYSIS     HEMOLYSIS AT THIS LEVEL MAY AFFECT RESULT   ALT 7  0 - 53 U/L   Alkaline Phosphatase 41  39 - 117 U/L   Total Bilirubin 0.2 (*) 0.3 - 1.2 mg/dL   GFR calc non Af Amer 83 (*) >90 mL/min   GFR calc Af Amer >90  >90 mL/min   Comment: (NOTE)     The eGFR has been calculated using the CKD EPI equation.     This calculation has not been validated in all clinical situations.     eGFR's persistently <90 mL/min signify possible Chronic Kidney     Disease.   Anion gap 13  5 - 15    No results found.  Review of Systems  All other systems reviewed and are negative.  Blood pressure 141/83, pulse 94, temperature 99.7 F (37.6 C), temperature source Oral, resp. rate 14, SpO2 97.00%. Physical Exam  Constitutional: He is oriented to person, place, and time. He appears well-developed and well-nourished.  HENT:  Head: Normocephalic and atraumatic.  Cardiovascular: Normal rate.   Respiratory: Effort normal.  Musculoskeletal:       Left hand: He exhibits tenderness and swelling.  Left index finger swelling and erythema c/w possible flexor sheath infection  Neurological: He is alert and oriented to person, place, and time.  Skin: Skin is warm.  Psychiatric: He has a normal  mood and affect. His behavior is normal. Judgment and thought content normal.    Assessment/Plan: As above  Probable flexor sheath infection  Will ad,it to medicine and perform I and D of flexor sheath tonight  Brett Davis A 05/09/2014, 10:57 PM

## 2014-05-10 ENCOUNTER — Encounter (HOSPITAL_COMMUNITY): Payer: Self-pay | Admitting: Orthopedic Surgery

## 2014-05-10 DIAGNOSIS — I252 Old myocardial infarction: Secondary | ICD-10-CM | POA: Diagnosis not present

## 2014-05-10 DIAGNOSIS — X17XXXA Contact with hot engines, machinery and tools, initial encounter: Secondary | ICD-10-CM | POA: Diagnosis present

## 2014-05-10 DIAGNOSIS — Z86010 Personal history of colonic polyps: Secondary | ICD-10-CM | POA: Diagnosis not present

## 2014-05-10 DIAGNOSIS — M069 Rheumatoid arthritis, unspecified: Secondary | ICD-10-CM | POA: Diagnosis present

## 2014-05-10 DIAGNOSIS — I10 Essential (primary) hypertension: Secondary | ICD-10-CM

## 2014-05-10 DIAGNOSIS — M6588 Other synovitis and tenosynovitis, other site: Secondary | ICD-10-CM | POA: Diagnosis present

## 2014-05-10 DIAGNOSIS — Z79899 Other long term (current) drug therapy: Secondary | ICD-10-CM | POA: Diagnosis not present

## 2014-05-10 DIAGNOSIS — I2510 Atherosclerotic heart disease of native coronary artery without angina pectoris: Secondary | ICD-10-CM | POA: Diagnosis present

## 2014-05-10 DIAGNOSIS — Y939 Activity, unspecified: Secondary | ICD-10-CM | POA: Diagnosis not present

## 2014-05-10 DIAGNOSIS — F329 Major depressive disorder, single episode, unspecified: Secondary | ICD-10-CM | POA: Diagnosis present

## 2014-05-10 DIAGNOSIS — K219 Gastro-esophageal reflux disease without esophagitis: Secondary | ICD-10-CM | POA: Diagnosis present

## 2014-05-10 DIAGNOSIS — Z7982 Long term (current) use of aspirin: Secondary | ICD-10-CM | POA: Diagnosis not present

## 2014-05-10 DIAGNOSIS — F42 Obsessive-compulsive disorder: Secondary | ICD-10-CM | POA: Diagnosis present

## 2014-05-10 DIAGNOSIS — Z885 Allergy status to narcotic agent status: Secondary | ICD-10-CM | POA: Diagnosis not present

## 2014-05-10 DIAGNOSIS — Z720 Tobacco use: Secondary | ICD-10-CM | POA: Diagnosis not present

## 2014-05-10 DIAGNOSIS — N529 Male erectile dysfunction, unspecified: Secondary | ICD-10-CM | POA: Diagnosis present

## 2014-05-10 DIAGNOSIS — E785 Hyperlipidemia, unspecified: Secondary | ICD-10-CM

## 2014-05-10 DIAGNOSIS — T23122A Burn of first degree of single left finger (nail) except thumb, initial encounter: Secondary | ICD-10-CM | POA: Diagnosis present

## 2014-05-10 DIAGNOSIS — F419 Anxiety disorder, unspecified: Secondary | ICD-10-CM | POA: Diagnosis present

## 2014-05-10 DIAGNOSIS — M65842 Other synovitis and tenosynovitis, left hand: Secondary | ICD-10-CM | POA: Diagnosis present

## 2014-05-10 DIAGNOSIS — K449 Diaphragmatic hernia without obstruction or gangrene: Secondary | ICD-10-CM | POA: Diagnosis present

## 2014-05-10 DIAGNOSIS — Z8249 Family history of ischemic heart disease and other diseases of the circulatory system: Secondary | ICD-10-CM | POA: Diagnosis not present

## 2014-05-10 DIAGNOSIS — R51 Headache: Secondary | ICD-10-CM | POA: Diagnosis present

## 2014-05-10 LAB — CBC
HCT: 35.1 % — ABNORMAL LOW (ref 39.0–52.0)
HEMOGLOBIN: 12 g/dL — AB (ref 13.0–17.0)
MCH: 33.2 pg (ref 26.0–34.0)
MCHC: 34.2 g/dL (ref 30.0–36.0)
MCV: 97.2 fL (ref 78.0–100.0)
Platelets: 183 10*3/uL (ref 150–400)
RBC: 3.61 MIL/uL — AB (ref 4.22–5.81)
RDW: 12.6 % (ref 11.5–15.5)
WBC: 10.1 10*3/uL (ref 4.0–10.5)

## 2014-05-10 LAB — COMPREHENSIVE METABOLIC PANEL
AST: 8 U/L (ref 0–37)
Albumin: 3.2 g/dL — ABNORMAL LOW (ref 3.5–5.2)
Alkaline Phosphatase: 37 U/L — ABNORMAL LOW (ref 39–117)
Anion gap: 10 (ref 5–15)
BUN: 14 mg/dL (ref 6–23)
CALCIUM: 8.6 mg/dL (ref 8.4–10.5)
CO2: 28 meq/L (ref 19–32)
CREATININE: 1.09 mg/dL (ref 0.50–1.35)
Chloride: 102 mEq/L (ref 96–112)
GFR calc non Af Amer: 79 mL/min — ABNORMAL LOW (ref >90)
GLUCOSE: 109 mg/dL — AB (ref 70–99)
Potassium: 3.8 mEq/L (ref 3.7–5.3)
Sodium: 140 mEq/L (ref 137–147)
TOTAL PROTEIN: 5.7 g/dL — AB (ref 6.0–8.3)
Total Bilirubin: 0.2 mg/dL — ABNORMAL LOW (ref 0.3–1.2)

## 2014-05-10 LAB — TSH: TSH: 5.78 u[IU]/mL — ABNORMAL HIGH (ref 0.350–4.500)

## 2014-05-10 LAB — HEMOGLOBIN A1C
Hgb A1c MFr Bld: 5.6 % (ref <5.7)
MEAN PLASMA GLUCOSE: 114 mg/dL (ref <117)

## 2014-05-10 LAB — GLUCOSE, CAPILLARY: Glucose-Capillary: 114 mg/dL — ABNORMAL HIGH (ref 70–99)

## 2014-05-10 MED ORDER — BUPIVACAINE HCL (PF) 0.25 % IJ SOLN
INTRAMUSCULAR | Status: AC
  Filled 2014-05-10: qty 30

## 2014-05-10 MED ORDER — ADULT MULTIVITAMIN W/MINERALS CH
1.0000 | ORAL_TABLET | Freq: Every day | ORAL | Status: DC
  Administered 2014-05-10 – 2014-05-11 (×2): 1 via ORAL
  Filled 2014-05-10 (×2): qty 1

## 2014-05-10 MED ORDER — LACTATED RINGERS IV SOLN: INTRAVENOUS | Status: DC

## 2014-05-10 MED ORDER — BUPIVACAINE HCL 0.25 % IJ SOLN
INTRAMUSCULAR | Status: DC | PRN
  Administered 2014-05-10: 7 mL

## 2014-05-10 MED ORDER — NITROGLYCERIN 0.4 MG SL SUBL: 0.4000 mg | SUBLINGUAL_TABLET | SUBLINGUAL | Status: DC | PRN

## 2014-05-10 MED ORDER — PROPOFOL 10 MG/ML IV BOLUS
INTRAVENOUS | Status: DC | PRN
  Administered 2014-05-09: 150 mg via INTRAVENOUS

## 2014-05-10 MED ORDER — OXYCODONE HCL 5 MG PO TABS
5.0000 mg | ORAL_TABLET | Freq: Four times a day (QID) | ORAL | Status: DC | PRN
  Administered 2014-05-10 – 2014-05-11 (×4): 5 mg via ORAL
  Filled 2014-05-10 (×4): qty 1

## 2014-05-10 MED ORDER — FENTANYL CITRATE 0.05 MG/ML IJ SOLN: INTRAMUSCULAR | Status: DC | PRN

## 2014-05-10 MED ORDER — FENTANYL CITRATE 0.05 MG/ML IJ SOLN
INTRAMUSCULAR | Status: DC | PRN
  Administered 2014-05-09: 100 ug via INTRAVENOUS

## 2014-05-10 MED ORDER — ALBUTEROL SULFATE (2.5 MG/3ML) 0.083% IN NEBU: 2.5000 mg | INHALATION_SOLUTION | Freq: Four times a day (QID) | RESPIRATORY_TRACT | Status: DC | PRN

## 2014-05-10 MED ORDER — VANCOMYCIN HCL IN DEXTROSE 1-5 GM/200ML-% IV SOLN
1000.0000 mg | Freq: Two times a day (BID) | INTRAVENOUS | Status: DC
Start: 2014-05-10 — End: 2014-05-11
  Administered 2014-05-10 – 2014-05-11 (×3): 1000 mg via INTRAVENOUS
  Filled 2014-05-10 (×4): qty 200

## 2014-05-10 MED ORDER — ALPRAZOLAM 0.5 MG PO TABS: 0.5000 mg | ORAL_TABLET | Freq: Three times a day (TID) | ORAL | Status: DC | PRN

## 2014-05-10 MED ORDER — MORPHINE SULFATE 2 MG/ML IJ SOLN
INTRAMUSCULAR | Status: AC
Start: 2014-05-10 — End: 2014-05-10
  Administered 2014-05-10: 2 mg via INTRAVENOUS
  Filled 2014-05-10: qty 1

## 2014-05-10 MED ORDER — VANCOMYCIN HCL IN DEXTROSE 1-5 GM/200ML-% IV SOLN
1000.0000 mg | Freq: Once | INTRAVENOUS | Status: AC
  Administered 2014-05-10: 1000 mg via INTRAVENOUS
  Filled 2014-05-10: qty 200

## 2014-05-10 MED ORDER — VITAMIN B-1 100 MG PO TABS
100.0000 mg | ORAL_TABLET | Freq: Every day | ORAL | Status: DC
  Administered 2014-05-10 – 2014-05-11 (×2): 100 mg via ORAL
  Filled 2014-05-10 (×2): qty 1

## 2014-05-10 MED ORDER — IBUPROFEN 200 MG PO TABS
200.0000 mg | ORAL_TABLET | Freq: Four times a day (QID) | ORAL | Status: DC | PRN
  Administered 2014-05-11: 200 mg via ORAL
  Filled 2014-05-10: qty 1

## 2014-05-10 MED ORDER — 0.9 % SODIUM CHLORIDE (POUR BTL) OPTIME
TOPICAL | Status: DC | PRN
  Administered 2014-05-10: 1000 mL

## 2014-05-10 MED ORDER — MORPHINE SULFATE 2 MG/ML IJ SOLN
1.0000 mg | INTRAMUSCULAR | Status: DC | PRN
  Administered 2014-05-10 – 2014-05-11 (×3): 2 mg via INTRAVENOUS
  Filled 2014-05-10 (×2): qty 1

## 2014-05-10 MED ORDER — ACETAMINOPHEN 650 MG RE SUPP: 650.0000 mg | Freq: Four times a day (QID) | RECTAL | Status: DC | PRN

## 2014-05-10 MED ORDER — ZOLPIDEM TARTRATE 5 MG PO TABS: 5.0000 mg | ORAL_TABLET | Freq: Every evening | ORAL | Status: DC | PRN

## 2014-05-10 MED ORDER — SIMVASTATIN 40 MG PO TABS
40.0000 mg | ORAL_TABLET | Freq: Every day | ORAL | Status: DC
  Administered 2014-05-10: 40 mg via ORAL
  Filled 2014-05-10 (×3): qty 1

## 2014-05-10 MED ORDER — LIDOCAINE HCL (CARDIAC) 20 MG/ML IV SOLN
INTRAVENOUS | Status: DC | PRN
  Administered 2014-05-09: 100 mg via INTRAVENOUS

## 2014-05-10 MED ORDER — ASPIRIN EC 81 MG PO TBEC
81.0000 mg | DELAYED_RELEASE_TABLET | Freq: Every day | ORAL | Status: DC
  Administered 2014-05-10 – 2014-05-11 (×2): 81 mg via ORAL
  Filled 2014-05-10 (×2): qty 1

## 2014-05-10 MED ORDER — SODIUM CHLORIDE 0.9 % IV SOLN
INTRAVENOUS | Status: DC
  Administered 2014-05-10 (×2): via INTRAVENOUS

## 2014-05-10 MED ORDER — ALBUTEROL SULFATE HFA 108 (90 BASE) MCG/ACT IN AERS: 2.0000 | INHALATION_SPRAY | Freq: Four times a day (QID) | RESPIRATORY_TRACT | Status: DC | PRN

## 2014-05-10 MED ORDER — FOLIC ACID 1 MG PO TABS
1.0000 mg | ORAL_TABLET | Freq: Every day | ORAL | Status: DC
Start: 2014-05-10 — End: 2014-05-11
  Administered 2014-05-10 – 2014-05-11 (×2): 1 mg via ORAL
  Filled 2014-05-10 (×2): qty 1

## 2014-05-10 MED ORDER — SUCCINYLCHOLINE CHLORIDE 20 MG/ML IJ SOLN
INTRAMUSCULAR | Status: DC | PRN
  Administered 2014-05-09: 60 mg via INTRAVENOUS

## 2014-05-10 MED ORDER — HYDROMORPHONE HCL 1 MG/ML IJ SOLN: 1.0000 mg | INTRAMUSCULAR | Status: DC | PRN

## 2014-05-10 MED ORDER — FENTANYL CITRATE 0.05 MG/ML IJ SOLN: 25.0000 ug | INTRAMUSCULAR | Status: DC | PRN

## 2014-05-10 MED ORDER — ACETAMINOPHEN 325 MG PO TABS
650.0000 mg | ORAL_TABLET | Freq: Four times a day (QID) | ORAL | Status: DC | PRN
  Administered 2014-05-10: 650 mg via ORAL
  Filled 2014-05-10: qty 2

## 2014-05-10 MED ORDER — NEBIVOLOL HCL 5 MG PO TABS
5.0000 mg | ORAL_TABLET | Freq: Every morning | ORAL | Status: DC
  Administered 2014-05-10 – 2014-05-11 (×2): 5 mg via ORAL
  Filled 2014-05-10 (×2): qty 1

## 2014-05-10 NOTE — Op Note (Signed)
See note [id number]

## 2014-05-10 NOTE — Op Note (Signed)
NAMEMarland Kitchen  MONISH, HALIBURTON NO.:  [id number]  MEDICAL RECORD NO.:  [id number]  LOCATION:  1606                         FACILITY:  [facility]  PHYSICIAN:  Artist Pais. Delisa Finck, M.D.DATE OF BIRTH:  [birth year]  DATE OF PROCEDURE:  05/10/2014 DATE OF DISCHARGE:                              OPERATIVE REPORT   PREOPERATIVE DIAGNOSIS:  Left index finger swelling, possible flexor sheath infection.  POSTOPERATIVE DIAGNOSIS:  Left index finger swelling, possible flexor sheath infection.  PROCEDURE:  Incision and drainage of above with cultures.  SURGEON:  Artist Pais. Mina Marble, M.D.  ASSISTANT:  None.  ANESTHESIA:  General.  COMPLICATIONS:  No complications.  DRAINS:  No drains.  DESCRIPTION OF PROCEDURE:  The patient was taken to the operating suite. After the induction of adequate general anesthetic, left upper extremity was prepped and draped in usual sterile fashion.  An Esmarch was used to exsanguinate the limb.  Tourniquet was inflated to 250 mmHg.  At this point in time, incision was made in the A1 pulley, left index finger.  I made a midlateral incision along the ulnar side of the DIP joint and the flexor sheath was entered.  A #5 pediatric feeding tube was then placed into the flexor sheath and 100 mL of normal saline was used to irrigate the flexor sheath.  The irrigant was clear from the beginning.  There was some slight cloudy fluid cultured at the A1 pulley.  No purulence was encountered.  We made a second incision dorsally over the middle phalanx in the PIP joint area.  Skin was incised.  Some cloudy fluid was cultured, but no frank purulence was encountered.  This wound was irrigated and then loosely closed with 4-0 nylon as well as the volar and midlateral wounds.  The patient was then placed in sterile dressing, 4x4s, Xeroform, and a compression wrap.  The patient tolerated the procedure well in concealed fashion.     Artist Pais Mina Marble,  M.D.     MAW/MEDQ  D:  05/10/2014  T:  05/10/2014  Job:  [id number]
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
## 2014-05-10 NOTE — Anesthesia Postprocedure Evaluation (Signed)
  Anesthesia Post-op Note  Patient: Brett Davis  Procedure(s) Performed: Procedure(s) (LRB): IRRIGATION AND DEBRIDEMENT LEFT INDEX FINGER (Left)  Patient Location: PACU  Anesthesia Type: General  Level of Consciousness: awake and alert   Airway and Oxygen Therapy: Patient Spontanous Breathing  Post-op Pain: mild  Post-op Assessment: Post-op Vital signs reviewed, Patient's Cardiovascular Status Stable, Respiratory Function Stable, Patent Airway and No signs of Nausea or vomiting  Last Vitals:  Filed Vitals:   05/10/14 0531  BP: 116/75  Pulse: 57  Temp: 37.3 C  Resp: 15    Post-op Vital Signs: stable   Complications: No apparent anesthesia complications

## 2014-05-10 NOTE — Progress Notes (Signed)
Patient ID: ERICA OSUNA, male   DOB: 1966/07/14, 48 y.o.   MRN: [id number] Patient underwent I and D of left index flexor sheath  Cloudy fluid cultured  No purulence encountered  Would continue IV ABX until WBC normalizes abd then d/c on PO  Will see in my office 10/16

## 2014-05-10 NOTE — Progress Notes (Signed)
UR completed 

## 2014-05-10 NOTE — Transfer of Care (Signed)
Immediate Anesthesia Transfer of Care Note  Patient: Brett Davis  Procedure(s) Performed: Procedure(s): IRRIGATION AND DEBRIDEMENT LEFT INDEX FINGER (Left)  Patient Location: PACU  Anesthesia Type:General  Level of Consciousness: awake and oriented  Airway & Oxygen Therapy: Patient Spontanous Breathing and Patient connected to face mask oxygen  Post-op Assessment: Report given to PACU RN and Post -op Vital signs reviewed and stable  Post vital signs: Reviewed and stable  Complications: No apparent anesthesia complications

## 2014-05-10 NOTE — Progress Notes (Signed)
ANTIBIOTIC CONSULT NOTE - INITIAL  Pharmacy Consult for Vancomycin Indication: Finger infection, Flexor tenosynovitis of finger S/p I and D of left index flexor sheath    Allergies  Allergen Reactions  . Codeine Nausea Only    Patient Measurements: Height: 5\' 10"  (177.8 cm) Weight: 167 lb 8.8 oz (76 kg) IBW/kg (Calculated) : 73 Adjusted Body Weight:   Vital Signs: Temp: 99.2 F (37.3 C) (10/06 0531) Temp Source: Oral (10/06 0531) BP: 116/75 mmHg (10/06 0531) Pulse Rate: 57 (10/06 0531) Intake/Output from previous day: 10/05 0701 - 10/06 0700 In: 850 [I.V.:850] Out: 5 [Blood:5] Intake/Output from this shift: Total I/O In: 850 [I.V.:850] Out: 5 [Blood:5]  Labs:  Recent Labs  05/09/14 2107 05/10/14 0155  WBC 13.7* 10.1  HGB 13.4 12.0*  PLT 196 183  CREATININE 1.04 1.09   Estimated Creatinine Clearance: 85.6 ml/min (by C-G formula based on Cr of 1.09). No results found for this basename: VANCOTROUGH, VANCOPEAK, VANCORANDOM, GENTTROUGH, GENTPEAK, GENTRANDOM, TOBRATROUGH, TOBRAPEAK, TOBRARND, AMIKACINPEAK, AMIKACINTROU, AMIKACIN,  in the last 72 hours   Microbiology: No results found for this or any previous visit (from the past 720 hour(s)).  Medical History: Past Medical History  Diagnosis Date  . Hypertension   . GERD (gastroesophageal reflux disease)   . Hiatal hernia   . Diverticulitis   . Hyperlipidemia   . History of endoscopy   . Abdominal pain   . Hiatal hernia   . Arthritis   . Generalized headaches   . Dizziness - light-headed   . NSTEMI (non-ST elevated myocardial infarction) 12/13/12    Occluded circ, patent RCA BMS   . Tobacco use disorder   . OCD (obsessive compulsive disorder)     with Depression and anxiety  . ED (erectile dysfunction)   . Colon polyps     Medications:  Anti-infectives   Start     Dose/Rate Route Frequency Ordered Stop   05/10/14 1200  vancomycin (VANCOCIN) IVPB 1000 mg/200 mL premix     1,000 mg 200 mL/hr over  60 Minutes Intravenous Every 12 hours 05/10/14 0545     05/10/14 0145  vancomycin (VANCOCIN) IVPB 1000 mg/200 mL premix     1,000 mg 200 mL/hr over 60 Minutes Intravenous  Once 05/10/14 0143 05/10/14 0318   05/09/14 2200  clindamycin (CLEOCIN) IVPB 600 mg     600 mg 100 mL/hr over 30 Minutes Intravenous  Once 05/09/14 2151 05/09/14 2324     Assessment: Patient with I and D of left index flexor sheath for infected finger.  First dose of antibiotics already given.   Goal of Therapy:  Vancomycin trough level 10-15 mcg/ml  Plan:  Measure antibiotic drug levels at steady state Follow up culture results Vancomycin 1gm iv q12hr  07/09/14, Darlina Guys Crowford 05/10/2014,5:47 AM

## 2014-05-11 LAB — BASIC METABOLIC PANEL
Anion gap: 10 (ref 5–15)
BUN: 11 mg/dL (ref 6–23)
CHLORIDE: 105 meq/L (ref 96–112)
CO2: 26 meq/L (ref 19–32)
Calcium: 8.5 mg/dL (ref 8.4–10.5)
Creatinine, Ser: 0.86 mg/dL (ref 0.50–1.35)
GFR calc non Af Amer: >90 mL/min (ref >90)
Glucose, Bld: 109 mg/dL — ABNORMAL HIGH (ref 70–99)
Potassium: 4.2 mEq/L (ref 3.7–5.3)
Sodium: 141 mEq/L (ref 137–147)

## 2014-05-11 LAB — GLUCOSE, CAPILLARY: Glucose-Capillary: 108 mg/dL — ABNORMAL HIGH (ref 70–99)

## 2014-05-11 LAB — CBC
HEMATOCRIT: 36.1 % — AB (ref 39.0–52.0)
HEMOGLOBIN: 12 g/dL — AB (ref 13.0–17.0)
MCH: 32.9 pg (ref 26.0–34.0)
MCHC: 33.2 g/dL (ref 30.0–36.0)
MCV: 98.9 fL (ref 78.0–100.0)
Platelets: 155 10*3/uL (ref 150–400)
RBC: 3.65 MIL/uL — ABNORMAL LOW (ref 4.22–5.81)
RDW: 12.6 % (ref 11.5–15.5)
WBC: 12.2 10*3/uL — AB (ref 4.0–10.5)

## 2014-05-11 MED ORDER — OXYCODONE HCL 5 MG PO TABS: 5.0000 mg | ORAL_TABLET | Freq: Four times a day (QID) | ORAL | Status: DC | PRN

## 2014-05-11 MED ORDER — CLINDAMYCIN HCL 150 MG PO CAPS: 450.0000 mg | ORAL_CAPSULE | Freq: Three times a day (TID) | ORAL | Status: DC

## 2014-05-11 NOTE — Progress Notes (Signed)
Pt to d/c home. AVS reviewed and "My Chart" discussed with pt. Pt capable of verbalizing medications and follow-up appointments. Remains hemodynamically stable. No signs and symptoms of distress. Educated pt to return to ER in the case of SOB, dizziness, or chest pain.  
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
## 2014-05-12 LAB — CULTURE, ROUTINE-ABSCESS

## 2014-05-12 NOTE — ED Provider Notes (Signed)
Medical screening examination/treatment/procedure(s) were conducted as a shared visit with non-physician practitioner(s) and myself.  I personally evaluated the patient during the encounter.   EKG Interpretation None       Briefly, pt is a 48 y.o. male presenting with finger infection of L hand.  I performed an examination on the patient including cardiac, pulmonary, and gi systems which were unremarkable.  Pt has all knavels signs, concerning for flexor tenosynovitis.  Consulted hand, pt taken to OR.     Mirian Mo, MD 05/12/14 [phone]

## 2014-05-13 LAB — CULTURE, ROUTINE-ABSCESS
Culture: NO GROWTH
Gram Stain: NONE SEEN

## 2014-05-14 LAB — ANAEROBIC CULTURE: Gram Stain: NONE SEEN

## 2014-05-16 LAB — CULTURE, BLOOD (ROUTINE X 2)
CULTURE: NO GROWTH
Culture: NO GROWTH

## 2014-07-11 ENCOUNTER — Other Ambulatory Visit: Payer: Self-pay | Admitting: Family Medicine

## 2014-07-11 DIAGNOSIS — N5089 Other specified disorders of the male genital organs: Secondary | ICD-10-CM

## 2014-07-13 ENCOUNTER — Other Ambulatory Visit: Payer: BC Managed Care – PPO

## 2014-07-14 ENCOUNTER — Encounter (HOSPITAL_COMMUNITY): Payer: Self-pay | Admitting: Cardiology

## 2014-08-08 ENCOUNTER — Ambulatory Visit
Admission: RE | Admit: 2014-08-08 | Discharge: 2014-08-08 | Disposition: A | Discharge status: Home / Self Care | Payer: BC Managed Care – PPO | Source: Ambulatory Visit | Attending: Anesthesiology | Admitting: Anesthesiology

## 2014-08-08 ENCOUNTER — Other Ambulatory Visit: Payer: Self-pay | Admitting: Anesthesiology

## 2014-08-08 ENCOUNTER — Ambulatory Visit
Admission: RE | Admit: 2014-08-08 | Discharge: 2014-08-08 | Disposition: A | Discharge status: Home / Self Care | Payer: BC Managed Care – PPO | Source: Ambulatory Visit | Attending: Family Medicine | Admitting: Family Medicine

## 2014-08-08 DIAGNOSIS — M545 Low back pain: Secondary | ICD-10-CM

## 2014-08-08 DIAGNOSIS — N5089 Other specified disorders of the male genital organs: Secondary | ICD-10-CM

## 2014-09-07 ENCOUNTER — Other Ambulatory Visit (INDEPENDENT_AMBULATORY_CARE_PROVIDER_SITE_OTHER): Payer: Self-pay | Admitting: General Surgery

## 2014-09-20 ENCOUNTER — Ambulatory Visit (INDEPENDENT_AMBULATORY_CARE_PROVIDER_SITE_OTHER): Payer: BLUE CROSS/BLUE SHIELD | Admitting: Cardiology

## 2014-09-20 ENCOUNTER — Encounter: Payer: Self-pay | Admitting: Cardiology

## 2014-09-20 VITALS — BP 160/98 | HR 57 | Ht 70.0 in | Wt 178.8 lb

## 2014-09-20 DIAGNOSIS — I10 Essential (primary) hypertension: Secondary | ICD-10-CM

## 2014-09-20 DIAGNOSIS — I2510 Atherosclerotic heart disease of native coronary artery without angina pectoris: Secondary | ICD-10-CM

## 2014-09-20 DIAGNOSIS — Z01818 Encounter for other preprocedural examination: Secondary | ICD-10-CM

## 2014-09-20 NOTE — Patient Instructions (Signed)
Your physician recommends that you continue on your current medications as directed. Please refer to the Current Medication list given to you today.  You have been cleared for surgery with Dr.Ingram. We will forward that information to his office  Your physician wants you to follow-up in: 6 months with Dr.Skains You will receive a reminder letter in the mail two months in advance. If you don't receive a letter, please call our office to schedule the follow-up appointment.  Your physician discussed the hazards of tobacco use. Tobacco use cessation is recommended and techniques and options to help you quit were discussed.

## 2014-09-20 NOTE — Progress Notes (Signed)
09/20/2014 Brett Davis   [birth year]  [id number]  Primary Physician Warrick Parisian, MD Primary Cardiologist: Dr. Anne Fu  Reason for visit/CC: Preoperative clearance for left inguinal hernia repair  HPI:  Brett Davis is a 49 year old male with a history of hypertension, hyperlipidemia and CAD. In 2009 he underwent PCI plus BMS to his RCA. In May 2014 he presented with chest pain and ruled in for non-ST myocardial infarction. Subsequent left heart catheterization revealed 100% proximal AV groove circumflex occlusion. It was felt that this occlusion occurred approximate 2 days prior to admission. Given the complete total occlusion and complete infarct, there was felt to be no benefit for percutaneous intervention to the circumflex artery. In addition, he was noted to have modest  right to left collateral blood flow to the circumflex artery. The previously placed right coronary stent was widely patent. Ejection fraction was normal at 50%. He was continued on medical therapy. He has failed to follow-up since that time. He now presents to clinic for preoperative clearance in order to undergo a left inguinal hernia repair by Dr. Derrell Lolling of [facility].  He reports that he has done fairly well since his last visit. He denies any recurrent anginal symptoms. No exertional chest pain. No dyspnea, orthopnea, PND, lower extremity edema, palpitations, syncope/near-syncope. He reports that 4 months ago he had to undergo emergency surgery on his hand that required general anesthesia. He tolerated surgery well from a cardiac standpoint without any operative/postoperative complications. EKG today demonstrates normal sinus rhythm without any ischemic abnormalities. Compared to his prior EKGs, there are no significant changes. His blood pressure is moderately elevated at 160/98. However, he notes history of whitecoat syndrome. He reports full medication compliance and continues to take aspirin on a  daily basis. Unfortunately, he continues to smoke.    Current Outpatient Prescriptions  Medication Sig Dispense Refill  . albuterol (PROVENTIL HFA;VENTOLIN HFA) 108 (90 BASE) MCG/ACT inhaler Inhale 2 puffs into the lungs every 6 (six) hours as needed for wheezing or shortness of breath.    Marland Kitchen albuterol (PROVENTIL) (2.5 MG/3ML) 0.083% nebulizer solution Take 2.5 mg by nebulization every 6 (six) hours as needed for wheezing or shortness of breath.    . ALPRAZolam (XANAX) 1 MG tablet Take 0.5-1 mg by mouth 3 (three) times daily as needed for anxiety. For anxiety    . aspirin EC 81 MG EC tablet Take 1 tablet (81 mg total) by mouth daily. 30 tablet 12  . ibuprofen (ADVIL,MOTRIN) 200 MG tablet Take 200 mg by mouth every 6 (six) hours as needed for moderate pain.    . nebivolol (BYSTOLIC) 5 MG tablet Take 5 mg by mouth every morning.     Marland Kitchen NITROSTAT 0.4 MG SL tablet Place 0.4 mg under the tongue every 5 (five) minutes as needed. For chest pain    . PARoxetine (PAXIL) 30 MG tablet Take 30 mg by mouth daily.  5  . simvastatin (ZOCOR) 40 MG tablet Take 1 tablet (40 mg total) by mouth at bedtime. 30 tablet 12   No current facility-administered medications for this visit.    Allergies  Allergen Reactions  . Codeine Nausea Only    History   Social History  . Marital Status: Legally Separated    Spouse Name: N/A  . Number of Children: N/A  . Years of Education: N/A   Occupational History  . Not on file.   Social History Main Topics  . Smoking status: Current Every Day Smoker -- 0.50  packs/day  . Smokeless tobacco: Never Used  . Alcohol Use: No  . Drug Use: No  . Sexual Activity: Not on file   Other Topics Concern  . Not on file   Social History Narrative     Review of Systems: General: negative for chills, fever, night sweats or weight changes.  Cardiovascular: negative for chest pain, dyspnea on exertion, edema, orthopnea, palpitations, paroxysmal nocturnal dyspnea or shortness of  breath Dermatological: negative for rash Respiratory: negative for cough or wheezing Urologic: negative for hematuria Abdominal: negative for nausea, vomiting, diarrhea, bright red blood per rectum, melena, or hematemesis Neurologic: negative for visual changes, syncope, or dizziness All other systems reviewed and are otherwise negative except as noted above.    Blood pressure 160/98, pulse 57, height 5\' 10"  (1.778 m), weight 178 lb 12.8 oz (81.103 kg).  General appearance: alert, cooperative and no distress Neck: no carotid bruit and no JVD Lungs: clear to auscultation bilaterally Heart: regular rate and rhythm, S1, S2 normal, no murmur, click, rub or gallop Extremities: no LEE Pulses: 2+ and symmetric Skin: warm and dry Neurologic: Grossly normal  EKG Sinus . HR 57 bpm. No ischemic abnormalities.   ASSESSMENT AND PLAN:   1. CAD: Stable without recurrent anginal symptoms. No exertional chest pain or dyspnea. His EKG demonstrates sinus rhythm without any ischemic abnormalities. He has h/o normal LVF with EF of 50% 12/2013. No s/s concerning for CHF. Continue medical therapy with aspirin, beta blocker and statin.  2. Preoperative clearance for left inguinal hernia repair: Based lack of anginal symptoms and stable EKG without ischemic abnormalities, no further ischemic workup is needed. He has been cleared to undergo left inguinal hernia repair with Dr. 01/2014. Since his last MI was more than 1 year ago, it is safe for him to stop aspirin therapy preoperatively if needed, however recommend continuation of 81 mg of aspirin daily once cleared by Dr. Derrell Lolling. Recommend continuation of his beta blocker and statin during the perioperative period. We also recommend monitoring on telemetry during postop recovery.  3. HTN: moderately elevated. Continue medical therapy.  4. HLD: continue statin therapy.  5. Tobacco abuse: smoking cessation strongly advised.   PLAN  Cleared for surgery. Will  forward to Dr. Derrell Lolling. See medication recommendations above. F/U with Dr. Derrell Lolling in 6 months.  SIMMONS, BRITTAINYPA-C 09/20/2014 2:23 PM

## 2014-09-27 ENCOUNTER — Other Ambulatory Visit (INDEPENDENT_AMBULATORY_CARE_PROVIDER_SITE_OTHER): Payer: Self-pay | Admitting: General Surgery

## 2014-09-27 ENCOUNTER — Other Ambulatory Visit: Payer: Self-pay | Admitting: Urology

## 2014-09-27 NOTE — Progress Notes (Signed)
Please put orders in Epic surgery 10-05-14 pre op 10-03-14 Thanks

## 2014-10-03 ENCOUNTER — Encounter (HOSPITAL_COMMUNITY)
Admission: RE | Admit: 2014-10-03 | Discharge: 2014-10-03 | Disposition: A | Discharge status: Home / Self Care | Payer: Worker's Compensation | Source: Ambulatory Visit | Attending: General Surgery | Admitting: General Surgery

## 2014-10-03 ENCOUNTER — Encounter (HOSPITAL_COMMUNITY): Payer: Self-pay

## 2014-10-03 DIAGNOSIS — K4090 Unilateral inguinal hernia, without obstruction or gangrene, not specified as recurrent: Secondary | ICD-10-CM | POA: Insufficient documentation

## 2014-10-03 DIAGNOSIS — F17200 Nicotine dependence, unspecified, uncomplicated: Secondary | ICD-10-CM | POA: Diagnosis not present

## 2014-10-03 DIAGNOSIS — E785 Hyperlipidemia, unspecified: Secondary | ICD-10-CM | POA: Diagnosis not present

## 2014-10-03 DIAGNOSIS — Z79899 Other long term (current) drug therapy: Secondary | ICD-10-CM | POA: Diagnosis not present

## 2014-10-03 DIAGNOSIS — G4700 Insomnia, unspecified: Secondary | ICD-10-CM | POA: Diagnosis not present

## 2014-10-03 DIAGNOSIS — E780 Pure hypercholesterolemia: Secondary | ICD-10-CM | POA: Diagnosis not present

## 2014-10-03 DIAGNOSIS — I739 Peripheral vascular disease, unspecified: Secondary | ICD-10-CM | POA: Diagnosis not present

## 2014-10-03 DIAGNOSIS — I252 Old myocardial infarction: Secondary | ICD-10-CM | POA: Diagnosis not present

## 2014-10-03 DIAGNOSIS — F419 Anxiety disorder, unspecified: Secondary | ICD-10-CM | POA: Diagnosis not present

## 2014-10-03 DIAGNOSIS — I2510 Atherosclerotic heart disease of native coronary artery without angina pectoris: Secondary | ICD-10-CM | POA: Diagnosis not present

## 2014-10-03 DIAGNOSIS — Z955 Presence of coronary angioplasty implant and graft: Secondary | ICD-10-CM | POA: Diagnosis not present

## 2014-10-03 DIAGNOSIS — F329 Major depressive disorder, single episode, unspecified: Secondary | ICD-10-CM | POA: Diagnosis not present

## 2014-10-03 DIAGNOSIS — N433 Hydrocele, unspecified: Secondary | ICD-10-CM | POA: Diagnosis not present

## 2014-10-03 DIAGNOSIS — M069 Rheumatoid arthritis, unspecified: Secondary | ICD-10-CM | POA: Diagnosis not present

## 2014-10-03 DIAGNOSIS — K219 Gastro-esophageal reflux disease without esophagitis: Secondary | ICD-10-CM | POA: Diagnosis not present

## 2014-10-03 DIAGNOSIS — Z01818 Encounter for other preprocedural examination: Secondary | ICD-10-CM

## 2014-10-03 DIAGNOSIS — G4730 Sleep apnea, unspecified: Secondary | ICD-10-CM | POA: Diagnosis not present

## 2014-10-03 DIAGNOSIS — Z8249 Family history of ischemic heart disease and other diseases of the circulatory system: Secondary | ICD-10-CM | POA: Diagnosis not present

## 2014-10-03 DIAGNOSIS — Z7982 Long term (current) use of aspirin: Secondary | ICD-10-CM | POA: Diagnosis not present

## 2014-10-03 DIAGNOSIS — J440 Chronic obstructive pulmonary disease with acute lower respiratory infection: Secondary | ICD-10-CM | POA: Diagnosis not present

## 2014-10-03 DIAGNOSIS — I10 Essential (primary) hypertension: Secondary | ICD-10-CM | POA: Diagnosis not present

## 2014-10-03 HISTORY — DX: Chronic obstructive pulmonary disease, unspecified: J44.9

## 2014-10-03 HISTORY — DX: Reserved for inherently not codable concepts without codable children: IMO0001

## 2014-10-03 HISTORY — DX: Atherosclerotic heart disease of native coronary artery without angina pectoris: I25.10

## 2014-10-03 HISTORY — DX: Anxiety disorder, unspecified: F41.9

## 2014-10-03 HISTORY — DX: Injury, unspecified, initial encounter: T14.90XA

## 2014-10-03 LAB — COMPREHENSIVE METABOLIC PANEL
ALT: 20 U/L (ref 0–53)
ANION GAP: 8 (ref 5–15)
AST: 20 U/L (ref 0–37)
Albumin: 4.6 g/dL (ref 3.5–5.2)
Alkaline Phosphatase: 42 U/L (ref 39–117)
BILIRUBIN TOTAL: 0.6 mg/dL (ref 0.3–1.2)
BUN: 11 mg/dL (ref 6–23)
CALCIUM: 9.7 mg/dL (ref 8.4–10.5)
CHLORIDE: 102 mmol/L (ref 96–112)
CO2: 31 mmol/L (ref 19–32)
CREATININE: 0.84 mg/dL (ref 0.50–1.35)
GFR calc Af Amer: >90 mL/min (ref >90)
GFR calc non Af Amer: >90 mL/min (ref >90)
Glucose, Bld: 93 mg/dL (ref 70–99)
Potassium: 4.7 mmol/L (ref 3.5–5.1)
Sodium: 141 mmol/L (ref 135–145)
TOTAL PROTEIN: 7.3 g/dL (ref 6.0–8.3)

## 2014-10-03 LAB — CBC WITH DIFFERENTIAL/PLATELET
Basophils Absolute: 0.1 10*3/uL (ref 0.0–0.1)
Basophils Relative: 1 % (ref 0–1)
Eosinophils Absolute: 0.6 10*3/uL (ref 0.0–0.7)
Eosinophils Relative: 5 % (ref 0–5)
HCT: 43.4 % (ref 39.0–52.0)
Hemoglobin: 14.4 g/dL (ref 13.0–17.0)
LYMPHS PCT: 35 % (ref 12–46)
Lymphs Abs: 4.4 10*3/uL — ABNORMAL HIGH (ref 0.7–4.0)
MCH: 33.1 pg (ref 26.0–34.0)
MCHC: 33.2 g/dL (ref 30.0–36.0)
MCV: 99.8 fL (ref 78.0–100.0)
Monocytes Absolute: 0.9 10*3/uL (ref 0.1–1.0)
Monocytes Relative: 7 % (ref 3–12)
NEUTROS ABS: 6.6 10*3/uL (ref 1.7–7.7)
Neutrophils Relative %: 52 % (ref 43–77)
PLATELETS: 290 10*3/uL (ref 150–400)
RBC: 4.35 MIL/uL (ref 4.22–5.81)
RDW: 12.9 % (ref 11.5–15.5)
WBC: 12.5 10*3/uL — AB (ref 4.0–10.5)

## 2014-10-03 NOTE — Progress Notes (Signed)
EKG per epic 09/20/2014 ECHO epic 12/14/2012 Heart Cath 12/18/2012 / epic Rest Stress Myoview epic 01/05/2009 Clearance per epic / Dr Anne Fu 09/20/2014

## 2014-10-03 NOTE — Progress Notes (Signed)
Your patient has screened at an elevated risk for Obstructive Sleep Apnea using the Stop-Bang Tool during a pre-surgical vist. A score of 4 or greater is an elevated risk. Score of 5.  

## 2014-10-03 NOTE — Patient Instructions (Signed)
20 Brett Davis  10/03/2014   Your procedure is scheduled on:      Wednesday October 05, 2014   Report to [facility] Main Entrance and follow signs to  Short Stay Center arrive at 12:30  PM.   Call this number if you have problems the morning of surgery [phone] or Presurgical Testing [phone].   Remember:  Do not eat food After Midnight, but may take clear liquids till 8:30 am day of surgery then nothing by mouth.    Take these medicines the morning of surgery with A SIP OF WATER: Alprazolam (Xanax) if needed; Albuterol Inhaler if needed (bring day of surgery); Albuterol Nebulizer if needed; Bystolic                                You may not have any metal on your body including hair pins and piercings  Do not wear jewelry, lotions, powders, colognes or deodorant.  Men may shave face and neck.               Do not bring valuables to the hospital. [facility] IS NOT RESPONSIBLE FOR VALUABLES.  Contacts, dentures or bridgework may not be worn into surgery.    Patients discharged the day of surgery will not be allowed to drive home.  Name and phone number of your driver:Brett Davis (girlfriend)  ________________________________________________________________________  [facility] - Preparing for Surgery Before surgery, you can play an important role.  Because skin is not sterile, your skin needs to be as free of germs as possible.  You can reduce the number of germs on your skin by washing with CHG (chlorahexidine gluconate) soap before surgery.  CHG is an antiseptic cleaner which kills germs and bonds with the skin to continue killing germs even after washing. Please DO NOT use if you have an allergy to CHG or antibacterial soaps.  If your skin becomes reddened/irritated stop using the CHG and inform your nurse when you arrive at Short Stay. Do not shave (including legs and underarms) for at least 48 hours prior to the first CHG shower.  You may shave your  face/neck. Please follow these instructions carefully:  1.  Shower with CHG Soap the night before surgery and the  morning of Surgery.  2.  If you choose to wash your hair, wash your hair first as usual with your  normal  shampoo.  3.  After you shampoo, rinse your hair and body thoroughly to remove the  shampoo.                           4.  Use CHG as you would any other liquid soap.  You can apply chg directly  to the skin and wash                       Gently with a scrungie or clean washcloth.  5.  Apply the CHG Soap to your body ONLY FROM THE NECK DOWN.   Do not use on face/ open                           Wound or open sores. Avoid contact with eyes, ears mouth and genitals (private parts).  Wash face,  Genitals (private parts) with your normal soap.             6.  Wash thoroughly, paying special attention to the area where your surgery  will be performed.  7.  Thoroughly rinse your body with warm water from the neck down.  8.  DO NOT shower/wash with your normal soap after using and rinsing off  the CHG Soap.                9.  Pat yourself dry with a clean towel.            10.  Wear clean pajamas.            11.  Place clean sheets on your bed the night of your first shower and do not  sleep with pets. Day of Surgery : Do not apply any lotions/deodorants the morning of surgery.  Please wear clean clothes to the hospital/surgery center.  FAILURE TO FOLLOW THESE INSTRUCTIONS MAY RESULT IN THE CANCELLATION OF YOUR SURGERY PATIENT SIGNATURE_________________________________  NURSE SIGNATURE__________________________________  ________________________________________________________________________    CLEAR LIQUID DIET   Foods Allowed                                                                     Foods Excluded  Coffee and tea, regular and decaf                             liquids that you cannot  Plain Jell-O in any flavor                                              see through such as: Fruit ices (not with fruit pulp)                                     milk, soups, orange juice  Iced Popsicles                                    All solid food Carbonated beverages, regular and diet                                    Cranberry, grape and apple juices Sports drinks like Gatorade Lightly seasoned clear broth or consume(fat free) Sugar, honey syrup  Sample Menu Breakfast                                Lunch                                     Supper Cranberry juice  Beef broth                            Chicken broth Jell-O                                     Grape juice                           Apple juice Coffee or tea                        Jell-O                                      Popsicle                                                Coffee or tea                        Coffee or tea  _____________________________________________________________________

## 2014-10-03 NOTE — H&P (Signed)
Brett Davis. [name]  Location: [facility] Surgery Patient #: [id number] DOB: 05-Jan-1966 Married / Language: English / Race: White Male      History of Present Illness  Patient words: hernia.  The patient is a 49 year old male who presents with an inguinal hernia. This is a pleasant 49 year old Caucasian gentleman, referred to me by Dr. Louis Meckel at [facility] for evaluation of a left inguinal hernia and left hydrocele. [facility] is his PCP. Dr. Marlou Porch is his cardiologist.  The stoma and has a history of right inguinal hernia repair 20 years ago. No complaints on the right side. 9 month history of swelling in the left scrotum and 6 months history of painful bulge in his left groin that he has to push back in. 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 He wants to go ahead and have this repaired. History of coronary artery disease, STEMI 2009, stent placed. On aspirin. No chest pain or exertional dyspnea. I performed Nissen fundoplication in 6195. Dr. Hulen Skains performed sigmoid colectomy for diverticulitis 2010. Hypertension. Tobacco abuse. Anxiety. History of rheumatoid arthritis on Humira. Borderline sleep apnea. He wants to go ahead and schedule the surgery. We will schedule him for open repair of his left inguinal hernia with mesh, and we will coordinate with Dr. Louis Meckel who can come in and repair the left hydrocele at the same time. This can be done as an outpatient. He wants to do this as soon as possible. I told him he would need cardiac clearance, but hopefully this would be straight forward since he has seen Dr. Marlou Porch last October I discussed the indications, details, techniques, and numerous risk of the surgery with him. He is aware the risk of bleeding, infection, recurrence of the hernia, nerve damage with chronic pain, injury to the testicle bladder or intestine with major reconstructive  surgery, cardiac pulmonary thromboembolic problems. She has these issues well. At this time all his questions were answered. He agrees with this plan. Addendum Note Confirmed Cardiac Assessment appointment with Dr. Marlou Porch on Tuesday, 09/20/2014.    Other Problems  Anxiety Disorder Arthritis Chronic Obstructive Lung Disease Gastroesophageal Reflux Disease High blood pressure Hypercholesterolemia Inguinal Hernia Myocardial infarction  Past Surgical History  Colon Removal - Partial Nissen Fundoplication Open Inguinal Hernia Surgery Right.  Diagnostic Studies History   Allergies  Codeine Phosphate *ANALGESICS - OPIOID*  Medication History Humira Pediatric Crohns Start (40MG /0.8ML Kit, Subcutaneous) Active. Albuterol Sulfate ER (4MG  Tablet ER 12HR, Oral) Active. Xanax (1MG  Tablet, Oral) Active. Bystolic (5MG  Tablet, Oral) Active. Nitrostat (0.4MG  Tab Sublingual, Sublingual as needed) Active. Simvastatin (40MG  Tablet, Oral) Active. Zinc Gluconate (50MG  Tablet, Oral) Active. OxyCODONE HCl (5MG  Capsule, Oral as needed) Active.  Social History  Alcohol use Remotely quit alcohol use. Caffeine use Coffee. No drug use Tobacco use Current every day smoker.  Family History  Arthritis Father, Mother. Heart disease in male family member before age 46 Hypertension Mother. Kidney Disease Mother.  Review of Systems  General Present- Fatigue. Not Present- Appetite Loss, Chills, Fever, Night Sweats, Weight Gain and Weight Loss. Skin Not Present- Change in Wart/Mole, Dryness, Hives, Jaundice, New Lesions, Non-Healing Wounds, Rash and Ulcer. HEENT Not Present- Earache, Hearing Loss, Hoarseness, Nose Bleed, Oral Ulcers, Ringing in the Ears, Seasonal Allergies, Sinus Pain, Sore Throat, Visual Disturbances, Wears glasses/contact lenses and Yellow Eyes. Respiratory Not Present- Bloody sputum, Chronic Cough, Difficulty Breathing, Snoring and  Wheezing. Cardiovascular Not Present- Chest Pain, Difficulty Breathing Lying Down,  Leg Cramps, Palpitations, Rapid Heart Rate, Shortness of Breath and Swelling of Extremities. Gastrointestinal Present- Abdominal Pain. Not Present- Bloating, Bloody Stool, Change in Bowel Habits, Chronic diarrhea, Constipation, Difficulty Swallowing, Excessive gas, Gets full quickly at meals, Hemorrhoids, Indigestion, Nausea, Rectal Pain and Vomiting. Male Genitourinary Present- Frequency. Not Present- Blood in Urine, Change in Urinary Stream, Impotence, Nocturia, Painful Urination, Urgency and Urine Leakage. Musculoskeletal Present- Back Pain, Joint Pain, Joint Stiffness, Muscle Pain and Swelling of Extremities. Not Present- Muscle Weakness. Neurological Not Present- Decreased Memory, Fainting, Headaches, Numbness, Seizures, Tingling, Tremor, Trouble walking and Weakness. Psychiatric Present- Anxiety. Not Present- Bipolar, Change in Sleep Pattern, Depression, Fearful and Frequent crying. Endocrine Not Present- Cold Intolerance, Excessive Hunger, Hair Changes, Heat Intolerance, Hot flashes and New Diabetes. Hematology Not Present- Easy Bruising, Excessive bleeding, Gland problems, HIV and Persistent Infections.   Vitals   Weight: 175 lb Height: 70in Body Surface Area: 1.98 m Body Mass Index: 25.11 kg/m Temp.: 98.60F(Temporal)  Pulse: 77 (Regular)  BP: 124/72 (Sitting, Left Arm, Standard)    Physical Exam  General Mental Status-Alert. General Appearance-Consistent with stated age. Hydration-Well hydrated. Voice-Normal.  Head and Neck Head-normocephalic, atraumatic with no lesions or palpable masses. Trachea-midline. Thyroid Gland Characteristics - normal size and consistency.  Eye Eyeball - Bilateral-Extraocular movements intact. Sclera/Conjunctiva - Bilateral-No scleral icterus.  Chest and Lung Exam Chest and lung exam reveals -quiet, even and easy respiratory  effort with no use of accessory muscles and on auscultation, normal breath sounds, no adventitious sounds and normal vocal resonance. Inspection Chest Wall - Normal. Back - normal.  Cardiovascular Cardiovascular examination reveals -normal heart sounds, regular rate and rhythm with no murmurs and normal pedal pulses bilaterally.  Abdomen Inspection Inspection of the abdomen reveals - No Hernias. Palpation/Percussion Palpation and Percussion of the abdomen reveal - Soft, Non Tender, No Rebound tenderness, No Rigidity (guarding) and No hepatosplenomegaly. Auscultation Auscultation of the abdomen reveals - Bowel sounds normal. Note: Lower midline scar well healed. Upper abdominal laparoscopic scars well-healed. No mass. No hernias. No tenderness.   Male Genitourinary Note: Small to medium size hernia left inguinal area. Reducible. Does not extend past the external ring. Left hydrocele, distinct and separate from hernia. No inguinal hernia on the right. Both testes descended.   Neurologic Neurologic evaluation reveals -alert and oriented x 3 with no impairment of recent or remote memory. Mental Status-Normal.  Musculoskeletal Normal Exam - Left-Upper Extremity Strength Normal and Lower Extremity Strength Normal. Normal Exam - Right-Upper Extremity Strength Normal and Lower Extremity Strength Normal.  Lymphatic Head & Neck  General Head & Neck Lymphatics: Bilateral - Description - Normal. Axillary  General Axillary Region: Bilateral - Description - Normal. Tenderness - Non Tender. Femoral & Inguinal  Generalized Femoral & Inguinal Lymphatics: Bilateral - Description - Normal. Tenderness - Non Tender.    Assessment & Plan  LEFT INGUINAL HERNIA (550.90  K40.90) Current Plans:      Schedule for Surgery You have a small to medium size left inguinal hernia. You also have a left hydrocele. We will coordinate surgery with Dr. Louis Meckel, and repair your left inguinal  hernia with mesh and remove the hydrocele at the same time. We have discussed the techniques and risks of the surgery in detail. We will ask your cardiologist for risk assessment prior to the surgery.  LEFT HYDROCELE (603.9  N43.3) Impression: Coordinate hernia repair with his urologist, Dr. Louis Meckel, who will perform hydrocelectomy    HYPERTENSION, BENIGN (401.1  I10)  RHEUMATOID ARTHRITIS AND OTHER  INFLAMMATORY POLYARTHROPATHIES (714.89  M06.4) Impression: Takes Humira. Ibuprofen. Oxycodone.  CAD IN NATIVE ARTERY (414.01  I25.10) Impression: History stent placement 2009.  HISTORY OF MI (MYOCARDIAL INFARCTION) (412  I25.2) Impression: NSTEMI. Followed by Dr. Marlou Porch annually. No chest pain or exertional dyspnea.  HISTORY OF NISSEN FUNDOPLICATION (N24.19  R42.48)  DIVERTICULITIS, COLON (562.11  K57.32) Impression: Sigmoid colectomy 2010, Dr. Hulen Skains.  Addendum Note Confirmed Cardiac Risk Assesssment appointment with Dr. Marlou Porch on Tuesday, 09/20/2014.     Edsel Petrin. Dalbert Batman, M.D., [facility] General and Minimally invasive Surgery Breast and Colorectal Surgery Office:   [phone] Pager:   [phone]

## 2014-10-05 ENCOUNTER — Ambulatory Visit: Payer: Self-pay | Admitting: Urology

## 2014-10-05 ENCOUNTER — Ambulatory Visit (HOSPITAL_COMMUNITY)
Admission: RE | Admit: 2014-10-05 | Discharge: 2014-10-05 | Disposition: A | Discharge status: Home / Self Care | Payer: Worker's Compensation | Source: Ambulatory Visit | Attending: General Surgery | Admitting: General Surgery

## 2014-10-05 ENCOUNTER — Ambulatory Visit (HOSPITAL_COMMUNITY): Payer: Worker's Compensation | Admitting: Certified Registered Nurse Anesthetist

## 2014-10-05 ENCOUNTER — Encounter (HOSPITAL_COMMUNITY)
Admission: RE | Disposition: A | Discharge status: Home / Self Care | Payer: Self-pay | Source: Ambulatory Visit | Attending: General Surgery

## 2014-10-05 ENCOUNTER — Encounter (HOSPITAL_COMMUNITY): Payer: Self-pay | Admitting: *Deleted

## 2014-10-05 DIAGNOSIS — E780 Pure hypercholesterolemia: Secondary | ICD-10-CM | POA: Insufficient documentation

## 2014-10-05 DIAGNOSIS — K4090 Unilateral inguinal hernia, without obstruction or gangrene, not specified as recurrent: Secondary | ICD-10-CM | POA: Diagnosis not present

## 2014-10-05 DIAGNOSIS — Z01818 Encounter for other preprocedural examination: Secondary | ICD-10-CM | POA: Insufficient documentation

## 2014-10-05 DIAGNOSIS — I252 Old myocardial infarction: Secondary | ICD-10-CM | POA: Insufficient documentation

## 2014-10-05 DIAGNOSIS — I2510 Atherosclerotic heart disease of native coronary artery without angina pectoris: Secondary | ICD-10-CM | POA: Diagnosis not present

## 2014-10-05 DIAGNOSIS — J440 Chronic obstructive pulmonary disease with acute lower respiratory infection: Secondary | ICD-10-CM | POA: Insufficient documentation

## 2014-10-05 DIAGNOSIS — M069 Rheumatoid arthritis, unspecified: Secondary | ICD-10-CM | POA: Insufficient documentation

## 2014-10-05 DIAGNOSIS — Z7982 Long term (current) use of aspirin: Secondary | ICD-10-CM | POA: Insufficient documentation

## 2014-10-05 DIAGNOSIS — I739 Peripheral vascular disease, unspecified: Secondary | ICD-10-CM | POA: Insufficient documentation

## 2014-10-05 DIAGNOSIS — Z955 Presence of coronary angioplasty implant and graft: Secondary | ICD-10-CM | POA: Insufficient documentation

## 2014-10-05 DIAGNOSIS — F419 Anxiety disorder, unspecified: Secondary | ICD-10-CM | POA: Insufficient documentation

## 2014-10-05 DIAGNOSIS — F329 Major depressive disorder, single episode, unspecified: Secondary | ICD-10-CM | POA: Insufficient documentation

## 2014-10-05 DIAGNOSIS — N433 Hydrocele, unspecified: Secondary | ICD-10-CM | POA: Diagnosis not present

## 2014-10-05 DIAGNOSIS — G4730 Sleep apnea, unspecified: Secondary | ICD-10-CM | POA: Insufficient documentation

## 2014-10-05 DIAGNOSIS — G4700 Insomnia, unspecified: Secondary | ICD-10-CM | POA: Insufficient documentation

## 2014-10-05 DIAGNOSIS — E785 Hyperlipidemia, unspecified: Secondary | ICD-10-CM | POA: Insufficient documentation

## 2014-10-05 DIAGNOSIS — Z79899 Other long term (current) drug therapy: Secondary | ICD-10-CM | POA: Insufficient documentation

## 2014-10-05 DIAGNOSIS — I10 Essential (primary) hypertension: Secondary | ICD-10-CM | POA: Insufficient documentation

## 2014-10-05 DIAGNOSIS — K219 Gastro-esophageal reflux disease without esophagitis: Secondary | ICD-10-CM | POA: Insufficient documentation

## 2014-10-05 DIAGNOSIS — F17200 Nicotine dependence, unspecified, uncomplicated: Secondary | ICD-10-CM | POA: Insufficient documentation

## 2014-10-05 DIAGNOSIS — Z8249 Family history of ischemic heart disease and other diseases of the circulatory system: Secondary | ICD-10-CM | POA: Insufficient documentation

## 2014-10-05 HISTORY — PX: HYDROCELE EXCISION: SHX482

## 2014-10-05 HISTORY — PX: INGUINAL HERNIA REPAIR: SHX194

## 2014-10-05 HISTORY — PX: INSERTION OF MESH: SHX5868

## 2014-10-05 SURGERY — REPAIR, HERNIA, INGUINAL, ADULT
Anesthesia: General | Site: Scrotum | Laterality: Left

## 2014-10-05 MED ORDER — SUFENTANIL CITRATE 50 MCG/ML IV SOLN
INTRAVENOUS | Status: AC
  Filled 2014-10-05: qty 1

## 2014-10-05 MED ORDER — CHLORHEXIDINE GLUCONATE 4 % EX LIQD
1.0000 | Freq: Once | CUTANEOUS | Status: DC
Start: 2014-10-06 — End: 2014-10-05

## 2014-10-05 MED ORDER — FENTANYL CITRATE 0.05 MG/ML IJ SOLN
25.0000 ug | INTRAMUSCULAR | Status: DC | PRN
  Administered 2014-10-05 (×2): 50 ug via INTRAVENOUS

## 2014-10-05 MED ORDER — NEOSTIGMINE METHYLSULFATE 10 MG/10ML IV SOLN
INTRAVENOUS | Status: AC
  Filled 2014-10-05: qty 1

## 2014-10-05 MED ORDER — ROCURONIUM BROMIDE 100 MG/10ML IV SOLN
INTRAVENOUS | Status: AC
Start: 2014-10-05 — End: 2014-10-05
  Filled 2014-10-05: qty 1

## 2014-10-05 MED ORDER — LIDOCAINE HCL (CARDIAC) 20 MG/ML IV SOLN
INTRAVENOUS | Status: AC
  Filled 2014-10-05: qty 5

## 2014-10-05 MED ORDER — DEXAMETHASONE SODIUM PHOSPHATE 10 MG/ML IJ SOLN
INTRAMUSCULAR | Status: DC | PRN
  Administered 2014-10-05: 10 mg via INTRAVENOUS

## 2014-10-05 MED ORDER — SUFENTANIL CITRATE 50 MCG/ML IV SOLN
INTRAVENOUS | Status: DC | PRN
  Administered 2014-10-05: 5 ug via INTRAVENOUS
  Administered 2014-10-05: 10 ug via INTRAVENOUS
  Administered 2014-10-05: 30 ug via INTRAVENOUS
  Administered 2014-10-05: 10 ug via INTRAVENOUS

## 2014-10-05 MED ORDER — LABETALOL HCL 5 MG/ML IV SOLN
INTRAVENOUS | Status: AC
  Filled 2014-10-05: qty 4

## 2014-10-05 MED ORDER — MIDAZOLAM HCL 5 MG/5ML IJ SOLN
INTRAMUSCULAR | Status: DC | PRN
  Administered 2014-10-05: 2 mg via INTRAVENOUS

## 2014-10-05 MED ORDER — CEFAZOLIN SODIUM-DEXTROSE 2-3 GM-% IV SOLR
2.0000 g | INTRAVENOUS | Status: AC
  Administered 2014-10-05: 2 g via INTRAVENOUS

## 2014-10-05 MED ORDER — SODIUM CHLORIDE 0.9 % IJ SOLN: 3.0000 mL | Freq: Two times a day (BID) | INTRAMUSCULAR | Status: DC

## 2014-10-05 MED ORDER — KETOROLAC TROMETHAMINE 30 MG/ML IJ SOLN
INTRAMUSCULAR | Status: AC
  Filled 2014-10-05: qty 1

## 2014-10-05 MED ORDER — GLYCOPYRROLATE 0.2 MG/ML IJ SOLN
INTRAMUSCULAR | Status: DC | PRN
  Administered 2014-10-05: 0.2 mg via INTRAVENOUS
  Administered 2014-10-05: 0.4 mg via INTRAVENOUS

## 2014-10-05 MED ORDER — ACETAMINOPHEN 325 MG PO TABS: 650.0000 mg | ORAL_TABLET | ORAL | Status: DC | PRN

## 2014-10-05 MED ORDER — PHENYLEPHRINE HCL 10 MG/ML IJ SOLN
INTRAMUSCULAR | Status: DC | PRN
  Administered 2014-10-05 (×2): 40 ug via INTRAVENOUS
  Administered 2014-10-05: 80 ug via INTRAVENOUS

## 2014-10-05 MED ORDER — ROCURONIUM BROMIDE 100 MG/10ML IV SOLN
INTRAVENOUS | Status: DC | PRN
  Administered 2014-10-05: 10 mg via INTRAVENOUS
  Administered 2014-10-05: 50 mg via INTRAVENOUS

## 2014-10-05 MED ORDER — GLYCOPYRROLATE 0.2 MG/ML IJ SOLN
INTRAMUSCULAR | Status: AC
  Filled 2014-10-05: qty 2

## 2014-10-05 MED ORDER — SODIUM CHLORIDE 0.9 % IV SOLN: 250.0000 mL | INTRAVENOUS | Status: DC | PRN

## 2014-10-05 MED ORDER — CEFAZOLIN SODIUM-DEXTROSE 2-3 GM-% IV SOLR
INTRAVENOUS | Status: AC
  Filled 2014-10-05: qty 50

## 2014-10-05 MED ORDER — LIDOCAINE-EPINEPHRINE 1 %-1:100000 IJ SOLN
INTRAMUSCULAR | Status: DC | PRN
  Administered 2014-10-05: 20 mL

## 2014-10-05 MED ORDER — PHENYLEPHRINE 40 MCG/ML (10ML) SYRINGE FOR IV PUSH (FOR BLOOD PRESSURE SUPPORT)
PREFILLED_SYRINGE | INTRAVENOUS | Status: AC
  Filled 2014-10-05: qty 10

## 2014-10-05 MED ORDER — PROMETHAZINE HCL 25 MG/ML IJ SOLN: 6.2500 mg | INTRAMUSCULAR | Status: DC | PRN

## 2014-10-05 MED ORDER — GLYCOPYRROLATE 0.2 MG/ML IJ SOLN
INTRAMUSCULAR | Status: AC
  Filled 2014-10-05: qty 1

## 2014-10-05 MED ORDER — OXYCODONE HCL 5 MG PO TABS
5.0000 mg | ORAL_TABLET | ORAL | Status: DC | PRN
  Administered 2014-10-05: 5 mg via ORAL
  Filled 2014-10-05: qty 1

## 2014-10-05 MED ORDER — CEFAZOLIN SODIUM-DEXTROSE 2-3 GM-% IV SOLR
2.0000 g | INTRAVENOUS | Status: DC
Start: 2014-10-06 — End: 2014-10-05

## 2014-10-05 MED ORDER — ACETAMINOPHEN 10 MG/ML IV SOLN
INTRAVENOUS | Status: DC | PRN
  Administered 2014-10-05: 1000 mg via INTRAVENOUS

## 2014-10-05 MED ORDER — EPHEDRINE SULFATE 50 MG/ML IJ SOLN
INTRAMUSCULAR | Status: DC | PRN
  Administered 2014-10-05: 5 mg via INTRAVENOUS
  Administered 2014-10-05: 7.5 mg via INTRAVENOUS

## 2014-10-05 MED ORDER — DEXAMETHASONE SODIUM PHOSPHATE 10 MG/ML IJ SOLN
INTRAMUSCULAR | Status: AC
  Filled 2014-10-05: qty 1

## 2014-10-05 MED ORDER — SODIUM CHLORIDE 0.9 % IJ SOLN: 3.0000 mL | INTRAMUSCULAR | Status: DC | PRN

## 2014-10-05 MED ORDER — BUPIVACAINE-EPINEPHRINE (PF) 0.5% -1:200000 IJ SOLN
INTRAMUSCULAR | Status: AC
  Filled 2014-10-05: qty 30

## 2014-10-05 MED ORDER — ACETAMINOPHEN 650 MG RE SUPP
650.0000 mg | RECTAL | Status: DC | PRN
  Filled 2014-10-05: qty 1

## 2014-10-05 MED ORDER — LACTATED RINGERS IV SOLN
INTRAVENOUS | Status: DC | PRN
  Administered 2014-10-05 (×3): via INTRAVENOUS

## 2014-10-05 MED ORDER — PROPOFOL 10 MG/ML IV BOLUS
INTRAVENOUS | Status: AC
  Filled 2014-10-05: qty 20

## 2014-10-05 MED ORDER — KETOROLAC TROMETHAMINE 30 MG/ML IJ SOLN
30.0000 mg | Freq: Once | INTRAMUSCULAR | Status: AC | PRN
  Administered 2014-10-05: 30 mg via INTRAVENOUS

## 2014-10-05 MED ORDER — BUPIVACAINE-EPINEPHRINE 0.5% -1:200000 IJ SOLN
INTRAMUSCULAR | Status: DC | PRN
  Administered 2014-10-05: 8 mL

## 2014-10-05 MED ORDER — SODIUM CHLORIDE 0.9 % IV SOLN: INTRAVENOUS | Status: DC

## 2014-10-05 MED ORDER — LIDOCAINE HCL (CARDIAC) 20 MG/ML IV SOLN
INTRAVENOUS | Status: DC | PRN
  Administered 2014-10-05: 80 mg via INTRAVENOUS
  Administered 2014-10-05: 40 mg via INTRATRACHEAL

## 2014-10-05 MED ORDER — FENTANYL CITRATE 0.05 MG/ML IJ SOLN
INTRAMUSCULAR | Status: AC
  Filled 2014-10-05: qty 2

## 2014-10-05 MED ORDER — NEOSTIGMINE METHYLSULFATE 10 MG/10ML IV SOLN
INTRAVENOUS | Status: DC | PRN
  Administered 2014-10-05: 3 mg via INTRAVENOUS

## 2014-10-05 MED ORDER — ACETAMINOPHEN 10 MG/ML IV SOLN
1000.0000 mg | Freq: Once | INTRAVENOUS | Status: DC
  Filled 2014-10-05: qty 100

## 2014-10-05 MED ORDER — LIDOCAINE-EPINEPHRINE 1 %-1:100000 IJ SOLN
INTRAMUSCULAR | Status: AC
  Filled 2014-10-05: qty 1

## 2014-10-05 MED ORDER — ESMOLOL HCL 10 MG/ML IV SOLN
INTRAVENOUS | Status: DC | PRN
  Administered 2014-10-05: 30 mg via INTRAVENOUS

## 2014-10-05 MED ORDER — LACTATED RINGERS IV SOLN
INTRAVENOUS | Status: DC
  Administered 2014-10-05: 1000 mL via INTRAVENOUS

## 2014-10-05 MED ORDER — OXYCODONE-ACETAMINOPHEN 7.5-325 MG PO TABS: 1.0000 | ORAL_TABLET | ORAL | Status: AC | PRN

## 2014-10-05 MED ORDER — ONDANSETRON HCL 4 MG/2ML IJ SOLN
INTRAMUSCULAR | Status: AC
  Filled 2014-10-05: qty 2

## 2014-10-05 MED ORDER — FENTANYL CITRATE 0.05 MG/ML IJ SOLN: 25.0000 ug | INTRAMUSCULAR | Status: DC | PRN

## 2014-10-05 MED ORDER — SODIUM CHLORIDE 0.9 % IJ SOLN
INTRAMUSCULAR | Status: AC
  Filled 2014-10-05: qty 10

## 2014-10-05 MED ORDER — PHENYLEPHRINE HCL 10 MG/ML IJ SOLN
10.0000 mg | INTRAVENOUS | Status: DC | PRN
  Administered 2014-10-05: 5 ug/min via INTRAVENOUS

## 2014-10-05 MED ORDER — ESMOLOL HCL 10 MG/ML IV SOLN
INTRAVENOUS | Status: AC
  Filled 2014-10-05: qty 10

## 2014-10-05 MED ORDER — MIDAZOLAM HCL 2 MG/2ML IJ SOLN
INTRAMUSCULAR | Status: AC
  Filled 2014-10-05: qty 2

## 2014-10-05 MED ORDER — PROPOFOL 10 MG/ML IV BOLUS
INTRAVENOUS | Status: DC | PRN
  Administered 2014-10-05: 200 mg via INTRAVENOUS

## 2014-10-05 MED ORDER — ONDANSETRON HCL 4 MG/2ML IJ SOLN
INTRAMUSCULAR | Status: DC | PRN
  Administered 2014-10-05: 4 mg via INTRAVENOUS

## 2014-10-05 SURGICAL SUPPLY — 56 items
BENZOIN TINCTURE PRP APPL 2/3 (GAUZE/BANDAGES/DRESSINGS) IMPLANT
BLADE HEX COATED 2.75 (ELECTRODE) ×3 IMPLANT
BLADE SURG 15 STRL LF DISP TIS (BLADE) ×2 IMPLANT
BLADE SURG 15 STRL SS (BLADE) ×1
BNDG GAUZE ELAST 4 BULKY (GAUZE/BANDAGES/DRESSINGS) ×3 IMPLANT
CHLORAPREP W/TINT 26ML (MISCELLANEOUS) ×3 IMPLANT
COVER SURGICAL LIGHT HANDLE (MISCELLANEOUS) ×3 IMPLANT
DECANTER SPIKE VIAL GLASS SM (MISCELLANEOUS) ×3 IMPLANT
DISSECTOR ROUND CHERRY 3/8 STR (MISCELLANEOUS) IMPLANT
DRAIN PENROSE 18X1/2 LTX STRL (DRAIN) IMPLANT
DRAPE LAPAROTOMY T 102X78X121 (DRAPES) ×3 IMPLANT
DRAPE LAPAROTOMY TRNSV 102X78 (DRAPE) ×3 IMPLANT
ELECT REM PT RETURN 9FT ADLT (ELECTROSURGICAL) ×6
ELECTRODE REM PT RTRN 9FT ADLT (ELECTROSURGICAL) ×4 IMPLANT
GAUZE SPONGE 4X4 12PLY STRL (GAUZE/BANDAGES/DRESSINGS) IMPLANT
GAUZE SPONGE 4X4 16PLY XRAY LF (GAUZE/BANDAGES/DRESSINGS) ×3 IMPLANT
GLOVE BIOGEL M STRL SZ7.5 (GLOVE) ×3 IMPLANT
GLOVE EUDERMIC 7 POWDERFREE (GLOVE) ×3 IMPLANT
GOWN STRL REUS W/TWL LRG LVL3 (GOWN DISPOSABLE) ×3 IMPLANT
GOWN STRL REUS W/TWL XL LVL3 (GOWN DISPOSABLE) ×6 IMPLANT
KIT BASIN OR (CUSTOM PROCEDURE TRAY) ×6 IMPLANT
LIQUID BAND (GAUZE/BANDAGES/DRESSINGS) ×6 IMPLANT
MESH ULTRAPRO 3X6 7.6X15CM (Mesh General) ×3 IMPLANT
NEEDLE HYPO 22GX1.5 SAFETY (NEEDLE) IMPLANT
NEEDLE HYPO 25X1 1.5 SAFETY (NEEDLE) ×3 IMPLANT
NS IRRIG 1000ML POUR BTL (IV SOLUTION) ×3 IMPLANT
PACK BASIC VI WITH GOWN DISP (CUSTOM PROCEDURE TRAY) ×3 IMPLANT
PACK GENERAL/GYN (CUSTOM PROCEDURE TRAY) ×6 IMPLANT
PENCIL BUTTON HOLSTER BLD 10FT (ELECTRODE) ×3 IMPLANT
SPONGE LAP 4X18 X RAY DECT (DISPOSABLE) ×9 IMPLANT
STAPLER VISISTAT 35W (STAPLE) IMPLANT
STRIP CLOSURE SKIN 1/2X4 (GAUZE/BANDAGES/DRESSINGS) IMPLANT
SUPPORT SCROTAL LG STRP (MISCELLANEOUS) IMPLANT
SUPPORT SCROTAL MED ADLT STRP (MISCELLANEOUS) IMPLANT
SUT CHROMIC 3 0 SH 27 (SUTURE) IMPLANT
SUT CHROMIC 4 0 RB 1X27 (SUTURE) IMPLANT
SUT MNCRL AB 4-0 PS2 18 (SUTURE) ×6 IMPLANT
SUT PROLENE 2 0 CT2 30 (SUTURE) ×9 IMPLANT
SUT SILK 2 0 (SUTURE) ×1
SUT SILK 2 0 SH (SUTURE) IMPLANT
SUT SILK 2-0 18XBRD TIE 12 (SUTURE) ×2 IMPLANT
SUT VIC AB 2-0 SH 27 (SUTURE) ×2
SUT VIC AB 2-0 SH 27X BRD (SUTURE) ×4 IMPLANT
SUT VIC AB 3-0 SH 27 (SUTURE) ×3
SUT VIC AB 3-0 SH 27X BRD (SUTURE) ×4 IMPLANT
SUT VIC AB 3-0 SH 27XBRD (SUTURE) ×2 IMPLANT
SUT VICRYL 2 0 18  UND BR (SUTURE) ×1
SUT VICRYL 2 0 18 UND BR (SUTURE) ×2 IMPLANT
SYR 20CC LL (SYRINGE) ×3 IMPLANT
SYR BULB IRRIGATION 50ML (SYRINGE) ×3 IMPLANT
SYR CONTROL 10ML LL (SYRINGE) IMPLANT
TOWEL OR 17X26 10 PK STRL BLUE (TOWEL DISPOSABLE) ×3 IMPLANT
TOWEL OR NON WOVEN STRL DISP B (DISPOSABLE) ×3 IMPLANT
TRAY FOLEY CATH 16FRSI W/METER (SET/KITS/TRAYS/PACK) IMPLANT
WATER STERILE IRR 1500ML POUR (IV SOLUTION) IMPLANT
YANKAUER SUCT BULB TIP 10FT TU (MISCELLANEOUS) IMPLANT

## 2014-10-05 NOTE — Anesthesia Preprocedure Evaluation (Addendum)
Anesthesia Evaluation  Patient identified by MRN, date of birth, ID band Patient awake    Reviewed: Allergy & Precautions, NPO status , Patient's Chart, lab work & pertinent test results  Airway Mallampati: II  TM Distance: >3 FB Neck ROM: Full    Dental no notable dental hx.    Pulmonary COPDCurrent Smoker,  breath sounds clear to auscultation  Pulmonary exam normal       Cardiovascular hypertension, Pt. on medications + CAD, + Past MI and + Cardiac Stents Rhythm:Regular Rate:Normal     Neuro/Psych negative neurological ROS  negative psych ROS   GI/Hepatic negative GI ROS, Neg liver ROS,   Endo/Other  negative endocrine ROS  Renal/GU negative Renal ROS  negative genitourinary   Musculoskeletal negative musculoskeletal ROS (+)   Abdominal   Peds negative pediatric ROS (+)  Hematology negative hematology ROS (+)   Anesthesia Other Findings   Reproductive/Obstetrics negative OB ROS                             Anesthesia Physical Anesthesia Plan  ASA: III  Anesthesia Plan: General   Post-op Pain Management:    Induction: Intravenous  Airway Management Planned: LMA and Oral ETT  Additional Equipment:   Intra-op Plan:   Post-operative Plan: Extubation in OR  Informed Consent: I have reviewed the patients History and Physical, chart, labs and discussed the procedure including the risks, benefits and alternatives for the proposed anesthesia with the patient or authorized representative who has indicated his/her understanding and acceptance.   Dental advisory given  Plan Discussed with: CRNA and Surgeon  Anesthesia Plan Comments:        Anesthesia Quick Evaluation

## 2014-10-05 NOTE — H&P (Signed)
Reason For Visit   left hydrocele   History of Present Illness   61M referred by Dr. Benedetto Goad, MD for eval and management of a left hydrocele. This has been present for at least 9 months. He had an u/s done on 08/08/14 confirming the hydrocele. The patient states that the hydrocele is causing him intermittent pain and discomfort. The patient has significant anxiety in this regard. He is engaged to be married this gentleman and would like to have this fixed or removed as soon as possible. The patient also describes a left inguinal hernia which is new and intermittently painful for him. It is reducible. The patient has a history of right inguinal hernia repair 20 years prior.  Patient had coronary artery stent 1 placed in 2009, he does not take any anticoagulants for this. He also had a Nissen procedure performed in 2011.     Past Medical History Problems  1. History of Acute bronchitis with chronic obstructive pulmonary disease (COPD) (J44.0) 2. History of Anxiety and depression (F41.9,F32.9) 3. History of Atherosclerotic heart disease of native coronary artery (I25.10) 4. History of Diaphragmatic hernia (K44.9) 5. History of esophageal reflux (Z87.19) 6. History of hyperlipidemia (Z86.39) 7. History of hypertension (Z86.79) 8. History of insomnia (Z87.898) 9. History of low back pain (Z87.39) 10. History of myocardial infarction (I25.2) 11. History of osteoarthritis (Z87.39) 12. History of peripheral vascular disease (Z86.79) 13. History of psoriasis (Z87.2) 14. History of rheumatoid arthritis (Z87.39) 15. History of sleep apnea (Z87.09)  Surgical History Problems  1. History of Cath Stent Placement 2. History of Colon Surgery  Current Meds 1. Albuterol Sulfate (2.5 MG/3ML) 0.083% Inhalation Nebulization Solution;  Therapy: (Recorded:18Jan2016) to Recorded 2. ALPRAZolam 1 MG Oral Tablet;  Therapy: (Recorded:18Jan2016) to Recorded 3. Ambien 10 MG Oral Tablet (Zolpidem  Tartrate);  Therapy: (Recorded:18Jan2016) to Recorded 4. Aspirin 325 MG Oral Tablet;  Therapy: (Recorded:18Jan2016) to Recorded 5. Bystolic 10 MG Oral Tablet;  Therapy: (Recorded:18Jan2016) to Recorded 6. Pantoprazole Sodium 40 MG Oral Tablet Delayed Release;  Therapy: (Recorded:18Jan2016) to Recorded 7. Simvastatin 40 MG Oral Tablet;  Therapy: (Recorded:18Jan2016) to Recorded 8. Zolpidem Tartrate 10 MG Oral Tablet;  Therapy: (Recorded:18Jan2016) to Recorded  Allergies Medication  1. No Known Drug Allergies  Family History Problems  1. Family history of Coronary artery disease : Maternal Grandfather 2. Family history of diabetes mellitus (Z83.3) : Maternal Grandmother 3. Family history of hypertension (Z82.49) : Mother 4. Family history of renal failure (Z84.1) : Mother  Social History Problems    Denied: History of Alcohol use   Caffeine use (F15.90)   1   Current every day smoker (F17.200)   1/2-3/4 ppd   Occupation   Education officer, environmental   Separated from significant other (Z63.5)  Review of Systems  Genitourinary: urinary frequency and nocturia.  Constitutional: feeling tired (fatigue).  Cardiovascular: chest pain.  Respiratory: shortness of breath.  Musculoskeletal: back pain and joint pain.  Neurological: headache and dizziness.  Psychiatric: anxiety.    Vitals Vital Signs [Data Includes: Last 1 Day]  Recorded: 18Jan2016 01:56PM  Height: 5 ft 10 in Weight: 175 lb  BMI Calculated: 25.11 BSA Calculated: 1.97 Recorded: 18Jan2016 01:50PM  Blood Pressure: 153 / 92 Temperature: 98.2 F Heart Rate: 80  Physical Exam Constitutional: Well nourished and well developed . No acute distress.   ENT:. The ears and nose are normal in appearance.   Neck: The appearance of the neck is normal and no neck mass is present.   Pulmonary: No  respiratory distress and normal respiratory rhythm and effort.   Cardiovascular: Heart rate and rhythm are normal . No peripheral  edema.   Abdomen: The abdomen is soft and nontender. No masses are palpated. No CVA tenderness.  A left inguinal hernia is present, which is reducible. No hepatosplenomegaly noted.   Rectal: The prostate exam was deferred.   Genitourinary: Examination of the penis demonstrates no discharge, no masses, no lesions and a normal meatus. The scrotum is without lesions. Examination of the left scrotum demostrates a hydrocele. The right epididymis is palpably normal and non-tender. The left epididymis is palpably normal and non-tender. The right testis is non-tender and without masses. The left testis is non-tender and without masses. The patient has a very small left hydrocele which is relatively soft and nontender.   Lymphatics: The femoral and inguinal nodes are not enlarged or tender.   Skin: Normal skin turgor, no visible rash and no visible skin lesions.   Neuro/Psych:. Mood and affect are appropriate.    Results/Data Urine [Data Includes: Last 1 Day]   18Jan2016  COLOR YELLOW   APPEARANCE CLEAR   SPECIFIC GRAVITY 1.015   pH 6.0   GLUCOSE NEG mg/dL  BILIRUBIN NEG   KETONE NEG mg/dL  BLOOD NEG   PROTEIN NEG mg/dL  UROBILINOGEN 0.2 mg/dL  NITRITE NEG   LEUKOCYTE ESTERASE NEG      Patient's urinalysis today is normal  Abdomen independently reviewed the patient's ultrasound showing a very small simple hydrocele on the patient's left side. Testicle otherwise normal appearing   Assessment Assessed  1. Left hydrocele (N43.3)    Left inguinal hernia  Left symptomatic hydrocele   Plan Health Maintenance  1. UA With REFLEX; [Do Not Release]; Status:Complete;   Done: 18Jan2016 01:37PM Left hydrocele  2. Follow-up Schedule Surgery Office  Follow-up  Status: Hold For - Appointment   Requested for: 18Jan2016 Left inguinal hernia  3. General Surgery Referral Referral  Referral for consideration of left inguinal hernia -   Status: Hold For - Appointment,Records  Requested for:  18Jan2016  Discussion/Summary   I discussed treatment options for him in regards to his hydrocele. I initially recommended surveillance given the size of it. However, the patient is anxious about it and is bothered by the associated pain and discomfort. We discussed the option of performing a hydrocelectomy at the time of his left inguinal hernia repair. I went over the procedure in detail including the risks and benefits. We talked about the risks of bleeding and infection. I warned him about the inflammation and swelling that occurs post-operatively and that he'd likely be out of work at least four weeks with the hernia repair and the hydrocelectomy. I will referr him to [facility] for consideration of left inguinal hernia repair. If he chooses to have his hernia fixed we can coordinate so that he is able to get both done under one anesthesia.

## 2014-10-05 NOTE — Op Note (Signed)
Preoperative diagnosis:  1. left hydrocele   Postoperative diagnosis:  1. left  hydrocele  Procedure:       left  hydrocelectomy  Surgeon: Crist Fat, MD  Anesthesia: General  Complications: None  Intraoperative findings: simple hydrocele without loculations  EBL: Minimal  Specimens: None  Indication: Indication: Brett Davis is a 49 y.o. patient with  Both left inguinal hernia and left spermatocele. The patient was evaluated by Dr. Claud Kelp who recommended left inguinal hernia repair. The patient had a left hydrocele and opted to have both a left inguinal hernia repair and left  Hydrocelectomy simultaneously..  After reviewing the management options for treatment, he elected to proceed with the above surgical procedure(s). We have discussed the potential benefits and risks of the procedure, side effects of the proposed treatment, the likelihood of the patient achieving the goals of the procedure, and any potential problems that might occur during the procedure or recuperation. Informed consent has been obtained.  Description of procedure:  The patient was taken to the operating room and general anesthesia was induced. The patient was placed on the table in supine position, general anesthesia was then induced and an LMA inserted. The scrotum was then prepped and draped in the routine sterile fashion. A timeout was then held with confirmation of antibiotics. Dr. Derrell Lolling performed a left inguinal hernia repair , please see his dictation for further details. A separate timeout was held after Dr. Derrell Lolling at finished his part of the procedure for a left hydrocelectomy.  I then made a transverse incision through the scrotal  skin and into the dartos. Once through several layers the dartos was able to get the left testicle and contents out of the left  hemiscrotum and into the surgical field.   The hydrocele sac was then dissected out removing the overlying layers of the  dartos tunica.  The sac was then opened with Metzenbaum scissors and the fluid drained from the sac.  The opening was then continued so that the entire sac was bivalved.  The edges were then removed, leaving a small edge of tissue around the testicle.  The edge of the remaining sac was cauterized.  The edge was then everted, brought together around the posterior aspect of the testicle and closed in a running fashion using 3-0  Vicryl.  The distal aspect was left open to prevent strangulating the cord.  Meticulous hemostasis was then achieved. The left hemiscrotum was then copiously irrigated and a final check for hemostasis performed. I then closed the dartos with a 3-0 Vicryl in a running/locking stitch. I closed the skin with a 4-0 Monocryl in a subcuticular fashion. I then injected 20 cc of quarter percent Marcaine into the incision, and then placed Dermabond over the incision. A fluff dressing and mesh underpants were then applied area.  The patient tolerated the procedure without any perioperative complications. At the end of the case all last needles and sponges had been accounted for. The patient was returned to the PACU in excellent condition.   Crist Fat, M.D.

## 2014-10-05 NOTE — Anesthesia Postprocedure Evaluation (Signed)
  Anesthesia Post-op Note  Patient: Brett Davis  Procedure(s) Performed: Procedure(s) (LRB): OPEN LEFT INGUINAL HERNIA  (Left) INSERTION OF MESH (Left) LEFT HYDROCELECTOMY ADULT (Left)  Patient Location: PACU  Anesthesia Type: General  Level of Consciousness: awake and alert   Airway and Oxygen Therapy: Patient Spontanous Breathing  Post-op Pain: mild  Post-op Assessment: Post-op Vital signs reviewed, Patient's Cardiovascular Status Stable, Respiratory Function Stable, Patent Airway and No signs of Nausea or vomiting  Last Vitals:  Filed Vitals:   10/05/14 1715  BP: 151/76  Pulse: 73  Temp:   Resp: 12    Post-op Vital Signs: stable   Complications: No apparent anesthesia complications

## 2014-10-05 NOTE — Discharge Instructions (Signed)
[facility] _______[facility]  UMBILICAL OR INGUINAL HERNIA REPAIR: POST OP INSTRUCTIONS    Always review your discharge instruction sheet given to you by the facility where your surgery was performed. IF YOU HAVE DISABILITY OR FAMILY LEAVE FORMS, YOU MUST BRING THEM TO THE OFFICE FOR PROCESSING.   DO NOT GIVE THEM TO YOUR DOCTOR.  1. A  prescription for pain medication may be given to you upon discharge.  Take your pain medication as prescribed, if needed.  If narcotic pain medicine is not needed, then you may take acetaminophen (Tylenol) or ibuprofen (Advil) as needed. 2. Take your usually prescribed medications unless otherwise directed. 3. If you need a refill on your pain medication, please contact your pharmacy.  They will contact our office to request authorization. Prescriptions will not be filled after 5 pm or on week-ends. 4. You should follow a light diet the first 24 hours after arrival home, such as soup and crackers, etc.  Be sure to include lots of fluids daily.  Resume your normal diet the day after surgery. 5. Most patients will experience some swelling and bruising around the umbilicus or in the groin and scrotum.  Ice packs and reclining will help.  Swelling and bruising can take several days to resolve.  6. It is common to experience some constipation if taking pain medication after surgery.  Increasing fluid intake and taking a stool softener (such as Colace) will usually help or prevent this problem from occurring.  A mild laxative (Milk of Magnesia or Miralax) should be taken according to package directions if there are no bowel movements after 48 hours. 7. Unless discharge instructions indicate otherwise, you may remove your bandages 24-48 hours after surgery, and you may shower at that time.  You may have steri-strips (small skin tapes) in place directly over the incision.  These strips should be left on the skin for 7-10 days.  If your surgeon used skin glue  on the incision, you may shower in 24 hours.  The glue will flake off over the next 2-3 weeks.  Any sutures or staples will be removed at the office during your follow-up visit. 8. ACTIVITIES:  You may resume regular (light) daily activities beginning the next day--such as daily self-care, walking, climbing stairs--gradually increasing activities as tolerated.  You may have sexual intercourse when it is comfortable.  Refrain from any heavy lifting or straining until approved by your doctor. a. You may drive when you are no longer taking prescription pain medication, you can comfortably wear a seatbelt, and you can safely maneuver your car and apply brakes. b. RETURN TO WORK:  ___5-6 weeks. No sports or lifting more than 15 pounds for 6 weeks _______________________________________________________ 9. You should see your doctor in the office for a follow-up appointment approximately 2-3 weeks after your surgery.  Make sure that you call for this appointment within a day or two after you arrive home to insure a convenient appointment time. 10. OTHER INSTRUCTIONS:  __________________________________________________________________________________________________________________________________________________________________________________________  WHEN TO CALL YOUR DOCTOR: 1. Fever over 101.0 2. Inability to urinate 3. Nausea and/or vomiting 4. Extreme swelling or bruising 5. Continued bleeding from incision. 6. Increased pain, redness, or drainage from the incision  The clinic staff is available to answer your questions during regular business hours.  Please dont hesitate to call and ask to speak to one of the nurses for clinical concerns.  If you have a medical emergency, go to the nearest emergency room or call  911.  A surgeon from [facility] is always on call at the hospital   [address], [address], [place], [place]  [address] ?  [address] [address], [place], [place]    [address] [phone] ? [phone] ? FAX [phone] Web site: [email or web address]   Discharge instructions following scrotal surgery  Call your doctor for:  Fever is greater than 100.5  Severe nausea or vomiting  Increasing pain not controlled by pain medication  Increasing redness or drainage from incisions  The number for questions or concerns is [phone]  Activity level: No lifting greater than 10 pounds (about equal to milk) for the next 2 weeks or until cleared to do so at follow-up appointment.  Otherwise activity as tolerated by comfort level.  Diet: May resume your regular diet as tolerated  Driving: No driving while still taking opiate pain medications (weight at least 6-8 hours after last dose).  No driving if you still sore from surgery as it may limit her ability to react quickly if necessary.   Shower/bath: May shower and get incision wet pad dry immediately following.  Do not scrub vigorously for the next 2-3 weeks.  Do not soak incision (ID soaking in bath or swimming) until told he may do so by Dr., as this may promote a wound infection.  Wound care: He may cover wounds with sterile gauze as needed to prevent incisions rubbing on close follow-up in any seepage.  Where tight fitting underpants for at least 2 weeks.  He should apply cold compresses (ice or sac of frozen peas/corn) to your scrotum for at least 48 hours to reduce the swelling.  You should expect that his scrotum will swell up initially and then get smaller over the next 2-4 weeks.  Follow-up appointments: Follow-up appointment will be scheduled with Dr. Marlou Porch in 6 weeks for a wound check.

## 2014-10-05 NOTE — Anesthesia Procedure Notes (Signed)
Procedure Name: Intubation Date/Time: 10/05/2014 2:42 PM Performed by: Illene Silver Pre-anesthesia Checklist: Patient identified and Emergency Drugs available Patient Re-evaluated:Patient Re-evaluated prior to inductionOxygen Delivery Method: Circle System Utilized Preoxygenation: Pre-oxygenation with 100% oxygen Intubation Type: IV induction Ventilation: Mask ventilation without difficulty Laryngoscope Size: Miller and 3 Grade View: Grade I Tube type: Oral Number of attempts: 1 Airway Equipment and Method: Stylet and Oral airway Placement Confirmation: ETT inserted through vocal cords under direct vision,  positive ETCO2 and breath sounds checked- equal and bilateral Secured at: 22 cm Tube secured with: Tape Dental Injury: Teeth and Oropharynx as per pre-operative assessment

## 2014-10-05 NOTE — Op Note (Signed)
Patient Name:           Brett Davis   Date of Surgery:        10/05/2014  Pre op Diagnosis:      Left inguinal hernia  Post op Diagnosis:    Indirect left inguinal hernia  Procedure:                 Open repair left inguinal hernia with mesh Armanda Heritage repair)  Surgeon:                     Angelia Mould. Derrell Lolling, M.D., FACS  Assistant:                      OR staff  Operative Indications:  This is a pleasant 49 year old Caucasian gentleman, referred to me by Dr. Berniece Salines at [facility] urology for evaluation of a left inguinal hernia and left hydrocele. [facility] is his PCP. Dr. Anne Fu is his cardiologist.  He has a history of right inguinal hernia repair 20 years ago. No complaints on the right side. 9 month history of swelling in the left scrotum and 6 months history of painful bulge in his left groin that he has to push back in. 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 He wants to go ahead and have this repaired. History of coronary artery disease, STEMI 2009, stent placed. On aspirin. No chest pain or exertional dyspnea. I performed Nissen fundoplication in 2012. Dr. Lindie Spruce performed sigmoid colectomy for diverticulitis 2010. Hypertension. Tobacco abuse. Anxiety. History of rheumatoid arthritis on Humira. Borderline sleep apnea. He wants to go ahead and schedule the surgery. We will schedule him for open repair of his left inguinal hernia with mesh, and we will coordinate with Dr. Marlou Porch who can come in and repair the left hydrocele at the same time. This can be done as an outpatient. Cardiac clearance was performed by Dr. Anne Fu.   Operative Findings:       I found an indirect left inguinal hernia. The sac was empty. I could palpate within the peritoneal cavity and there was no evidence of femoral hernia. The repair was a standard Cytogeneticist. Dr. Marlou Porch will dictate repair of the left hydrocele  separately.  Procedure in Detail:          Following the induction of general endotracheal anesthesia the patient's abdomen groins and genitalia were prepped and draped in a sterile fashion. Intravenous antibiotics were given. Surgical timeout was performed. 0.5% Marcaine with epinephrine was used as a local infiltration anesthetic into the skin subcutaneous tissue the muscle layers and transversus abdominal block.     A transverse incision was made in the left groin overlying the inguinal canal. Dissection was carried down to the external oblique. The external oblique was incised in the direction of its fibers, opening up the external inguinal ring. The external oblique was dissected away from the underlying structures and self-retaining retractor was placed. Ilioinguinal sensory nerve was isolated, traced back to its emergence from the muscle laterally clamped divided and ligated with 2-0 silk tie. The iliohypogastric nerve was also clamped divided and ligated with 2-0 silk tie. The cord structures were mobilized and encircled with a Penrose drain. Cremasteric muscle fibers were skeletonized. I isolated the indirect hernia sac and dissected it all the way back to the level of the internal ring. The sac was opened and inspected as described above. The sac was  then twisted and suture ligated at the level of the internal ring with suture ligature of 2-0 Vicryl. The redundant sac was excised and discarded. The floor of the inguinal canal was repaired and reinforced with an onlay graft of ultra Pro mesh. A 3" x 6" piece of ultra Pro  mesh was brought to the  field, trimmed at the corners to accommodate the anatomy of the wound and sutured in place with 2-0 Prolene sutures. The mesh was sutured so as to generously overlap the fascia at the pubic tubercle, then along the inguinal ligament inferiorly. Medially, superiorly, and superiolaterally several mattress sutures of 2-0 Prolene were placed. The mesh was incised  laterally so as to wraparound the cord structures at the internal ring. The tails of the mesh were overlapped laterally and further sutures were placed laterally. This provided secure coverage and repair both medial and lateral to the internal ring but allowed an adequate fingertip opening for the cord structures. The wound was irrigated with saline. Hemostasis was excellent. The external oblique was closed with a running suture of 2-0 Vicryl placing the cord structures deep to the external oblique. Scarpa's fascia was closed with 3-0 Vicryl and the skin closed with a running subcuticular suture of 4-0 Monocryl and Dermabond. The patient tolerated this part of procedure well. EBL 10 mL. Sponge and needle  counts were correct. Complications none.     At this point the case Dr. Marlou Porch scrubbed in and will dictate the left hydrocelectomy  separately    Angelia Mould. Derrell Lolling, M.D., FACS General and Minimally Invasive Surgery Breast and Colorectal Surgery  10/05/2014 3:50 PM

## 2014-10-05 NOTE — Transfer of Care (Signed)
Immediate Anesthesia Transfer of Care Note  Patient: Brett Davis  Procedure(s) Performed: Procedure(s): OPEN LEFT INGUINAL HERNIA  (Left) INSERTION OF MESH (Left) LEFT HYDROCELECTOMY ADULT (Left)  Patient Location: PACU  Anesthesia Type:General  Level of Consciousness: awake, alert , oriented and patient cooperative  Airway & Oxygen Therapy: Patient Spontanous Breathing and Patient connected to face mask oxygen  Post-op Assessment: Report given to RN, Post -op Vital signs reviewed and stable and Patient moving all extremities X 4  Post vital signs: stable  Last Vitals:  Filed Vitals:   10/05/14 1244  BP: 154/90  Pulse: 65  Temp: 36.6 C  Resp: 18    Complications: No apparent anesthesia complications

## 2014-10-05 NOTE — Interval H&P Note (Signed)
History and Physical Interval Note:  10/05/2014 2:09 PM  Brett Davis  has presented today for surgery, with the diagnosis of left inguinal hernia  The various methods of treatment have been discussed with the patient and family. After consideration of risks, benefits and other options for treatment, the patient has consented to  Procedure(s): OPEN LEFT INGUINAL HERNIA  (Left) INSERTION OF MESH (Left) LEFT HYDROCELECTOMY ADULT (Left) as a surgical intervention .  Dr. Marlou Porch to perform the hydrocelectomy. The patient's history has been reviewed, patient examined, no change in status, stable for surgery.  I have reviewed the patient's chart and labs.  Questions were answered to the patient's satisfaction.     Ernestene Mention

## 2014-10-07 ENCOUNTER — Encounter (HOSPITAL_COMMUNITY): Payer: Self-pay | Admitting: General Surgery

## 2014-10-10 ENCOUNTER — Encounter (HOSPITAL_COMMUNITY)
Admission: EM | Disposition: A | Discharge status: Home / Self Care | Payer: Self-pay | Source: Home / Self Care | Attending: Emergency Medicine

## 2014-10-10 ENCOUNTER — Emergency Department (HOSPITAL_COMMUNITY)
Admission: EM | Admit: 2014-10-10 | Discharge: 2014-10-10 | Disposition: A | Discharge status: Home / Self Care | Payer: BLUE CROSS/BLUE SHIELD | Attending: Emergency Medicine | Admitting: Emergency Medicine

## 2014-10-10 ENCOUNTER — Encounter (HOSPITAL_COMMUNITY): Payer: Self-pay | Admitting: Emergency Medicine

## 2014-10-10 ENCOUNTER — Emergency Department (HOSPITAL_COMMUNITY): Payer: BLUE CROSS/BLUE SHIELD

## 2014-10-10 ENCOUNTER — Emergency Department (HOSPITAL_COMMUNITY): Payer: BLUE CROSS/BLUE SHIELD | Admitting: Anesthesiology

## 2014-10-10 DIAGNOSIS — N529 Male erectile dysfunction, unspecified: Secondary | ICD-10-CM | POA: Insufficient documentation

## 2014-10-10 DIAGNOSIS — I252 Old myocardial infarction: Secondary | ICD-10-CM | POA: Diagnosis not present

## 2014-10-10 DIAGNOSIS — F42 Obsessive-compulsive disorder: Secondary | ICD-10-CM | POA: Diagnosis not present

## 2014-10-10 DIAGNOSIS — K219 Gastro-esophageal reflux disease without esophagitis: Secondary | ICD-10-CM | POA: Diagnosis not present

## 2014-10-10 DIAGNOSIS — E785 Hyperlipidemia, unspecified: Secondary | ICD-10-CM | POA: Insufficient documentation

## 2014-10-10 DIAGNOSIS — L03011 Cellulitis of right finger: Secondary | ICD-10-CM | POA: Insufficient documentation

## 2014-10-10 DIAGNOSIS — F17210 Nicotine dependence, cigarettes, uncomplicated: Secondary | ICD-10-CM | POA: Insufficient documentation

## 2014-10-10 DIAGNOSIS — I2510 Atherosclerotic heart disease of native coronary artery without angina pectoris: Secondary | ICD-10-CM | POA: Diagnosis not present

## 2014-10-10 DIAGNOSIS — I10 Essential (primary) hypertension: Secondary | ICD-10-CM | POA: Diagnosis not present

## 2014-10-10 DIAGNOSIS — L02511 Cutaneous abscess of right hand: Secondary | ICD-10-CM

## 2014-10-10 DIAGNOSIS — F419 Anxiety disorder, unspecified: Secondary | ICD-10-CM | POA: Insufficient documentation

## 2014-10-10 DIAGNOSIS — J449 Chronic obstructive pulmonary disease, unspecified: Secondary | ICD-10-CM | POA: Insufficient documentation

## 2014-10-10 DIAGNOSIS — Z79899 Other long term (current) drug therapy: Secondary | ICD-10-CM | POA: Diagnosis not present

## 2014-10-10 DIAGNOSIS — F329 Major depressive disorder, single episode, unspecified: Secondary | ICD-10-CM | POA: Diagnosis not present

## 2014-10-10 HISTORY — PX: I & D EXTREMITY: SHX5045

## 2014-10-10 LAB — COMPREHENSIVE METABOLIC PANEL
ALT: 12 U/L (ref 0–53)
AST: 15 U/L (ref 0–37)
Albumin: 4.1 g/dL (ref 3.5–5.2)
Alkaline Phosphatase: 50 U/L (ref 39–117)
Anion gap: 8 (ref 5–15)
BUN: 12 mg/dL (ref 6–23)
CALCIUM: 9 mg/dL (ref 8.4–10.5)
CO2: 26 mmol/L (ref 19–32)
Chloride: 106 mmol/L (ref 96–112)
Creatinine, Ser: 0.9 mg/dL (ref 0.50–1.35)
GFR calc non Af Amer: >90 mL/min (ref >90)
GLUCOSE: 104 mg/dL — AB (ref 70–99)
Potassium: 4 mmol/L (ref 3.5–5.1)
Sodium: 140 mmol/L (ref 135–145)
Total Bilirubin: 0.4 mg/dL (ref 0.3–1.2)
Total Protein: 7.2 g/dL (ref 6.0–8.3)

## 2014-10-10 LAB — CBC WITH DIFFERENTIAL/PLATELET
Basophils Absolute: 0.1 10*3/uL (ref 0.0–0.1)
Basophils Relative: 0 % (ref 0–1)
EOS ABS: 0.6 10*3/uL (ref 0.0–0.7)
Eosinophils Relative: 3 % (ref 0–5)
HEMATOCRIT: 41 % (ref 39.0–52.0)
Hemoglobin: 13.8 g/dL (ref 13.0–17.0)
Lymphocytes Relative: 22 % (ref 12–46)
Lymphs Abs: 4 10*3/uL (ref 0.7–4.0)
MCH: 33.4 pg (ref 26.0–34.0)
MCHC: 33.7 g/dL (ref 30.0–36.0)
MCV: 99.3 fL (ref 78.0–100.0)
Monocytes Absolute: 1.6 10*3/uL — ABNORMAL HIGH (ref 0.1–1.0)
Monocytes Relative: 9 % (ref 3–12)
Neutro Abs: 11.7 10*3/uL — ABNORMAL HIGH (ref 1.7–7.7)
Neutrophils Relative %: 66 % (ref 43–77)
Platelets: 276 10*3/uL (ref 150–400)
RBC: 4.13 MIL/uL — ABNORMAL LOW (ref 4.22–5.81)
RDW: 12.9 % (ref 11.5–15.5)
WBC: 17.9 10*3/uL — ABNORMAL HIGH (ref 4.0–10.5)

## 2014-10-10 SURGERY — IRRIGATION AND DEBRIDEMENT EXTREMITY
Anesthesia: General | Site: Hand | Laterality: Right

## 2014-10-10 MED ORDER — PROMETHAZINE HCL 25 MG/ML IJ SOLN: 6.2500 mg | INTRAMUSCULAR | Status: DC | PRN

## 2014-10-10 MED ORDER — ONDANSETRON HCL 4 MG/2ML IJ SOLN
INTRAMUSCULAR | Status: DC | PRN
  Administered 2014-10-10: 4 mg via INTRAVENOUS

## 2014-10-10 MED ORDER — PIPERACILLIN-TAZOBACTAM 3.375 G IVPB 30 MIN
3.3750 g | Freq: Once | INTRAVENOUS | Status: AC
  Administered 2014-10-10: 3.375 g via INTRAVENOUS
  Filled 2014-10-10: qty 50

## 2014-10-10 MED ORDER — BUPIVACAINE HCL (PF) 0.25 % IJ SOLN
INTRAMUSCULAR | Status: AC
  Filled 2014-10-10: qty 30

## 2014-10-10 MED ORDER — PROPOFOL 10 MG/ML IV BOLUS
INTRAVENOUS | Status: DC | PRN
Start: 2014-10-10 — End: 2014-10-10
  Administered 2014-10-10: 170 mg via INTRAVENOUS

## 2014-10-10 MED ORDER — VANCOMYCIN HCL IN DEXTROSE 1-5 GM/200ML-% IV SOLN
1000.0000 mg | Freq: Once | INTRAVENOUS | Status: AC
Start: 2014-10-10 — End: 2014-10-10
  Administered 2014-10-10: 1000 mg via INTRAVENOUS
  Filled 2014-10-10: qty 200

## 2014-10-10 MED ORDER — PIPERACILLIN-TAZOBACTAM 3.375 G IVPB: 3.3750 g | Freq: Three times a day (TID) | INTRAVENOUS | Status: DC

## 2014-10-10 MED ORDER — 0.9 % SODIUM CHLORIDE (POUR BTL) OPTIME
TOPICAL | Status: DC | PRN
  Administered 2014-10-10: 1000 mL

## 2014-10-10 MED ORDER — BUPIVACAINE HCL 0.25 % IJ SOLN
INTRAMUSCULAR | Status: DC | PRN
  Administered 2014-10-10: 3 mL

## 2014-10-10 MED ORDER — PROPOFOL 10 MG/ML IV BOLUS
INTRAVENOUS | Status: AC
  Filled 2014-10-10: qty 20

## 2014-10-10 MED ORDER — OXYCODONE-ACETAMINOPHEN 5-325 MG PO TABS: 1.0000 | ORAL_TABLET | ORAL | Status: DC | PRN

## 2014-10-10 MED ORDER — FENTANYL CITRATE 0.05 MG/ML IJ SOLN
INTRAMUSCULAR | Status: DC | PRN
  Administered 2014-10-10 (×2): 50 ug via INTRAVENOUS

## 2014-10-10 MED ORDER — SODIUM CHLORIDE 0.9 % IV SOLN
INTRAVENOUS | Status: DC | PRN
  Administered 2014-10-10: 19:00:00 via INTRAVENOUS

## 2014-10-10 MED ORDER — HYDROMORPHONE HCL 1 MG/ML IJ SOLN: 0.2500 mg | INTRAMUSCULAR | Status: DC | PRN

## 2014-10-10 MED ORDER — HYDROMORPHONE HCL 1 MG/ML IJ SOLN
1.0000 mg | Freq: Once | INTRAMUSCULAR | Status: AC
  Administered 2014-10-10: 1 mg via INTRAVENOUS
  Filled 2014-10-10: qty 1

## 2014-10-10 MED ORDER — OXYCODONE HCL 5 MG PO TABS: 5.0000 mg | ORAL_TABLET | Freq: Once | ORAL | Status: DC | PRN

## 2014-10-10 MED ORDER — LIDOCAINE HCL (CARDIAC) 20 MG/ML IV SOLN
INTRAVENOUS | Status: DC | PRN
  Administered 2014-10-10: 100 mg via INTRAVENOUS

## 2014-10-10 MED ORDER — FENTANYL CITRATE 0.05 MG/ML IJ SOLN
INTRAMUSCULAR | Status: AC
  Filled 2014-10-10: qty 2

## 2014-10-10 MED ORDER — LIDOCAINE HCL (CARDIAC) 20 MG/ML IV SOLN
INTRAVENOUS | Status: AC
  Filled 2014-10-10: qty 5

## 2014-10-10 MED ORDER — VANCOMYCIN HCL IN DEXTROSE 1-5 GM/200ML-% IV SOLN
1000.0000 mg | Freq: Two times a day (BID) | INTRAVENOUS | Status: DC
Start: 2014-10-11 — End: 2014-10-10

## 2014-10-10 MED ORDER — OXYCODONE HCL 5 MG/5ML PO SOLN
5.0000 mg | Freq: Once | ORAL | Status: DC | PRN
  Filled 2014-10-10: qty 5

## 2014-10-10 SURGICAL SUPPLY — 34 items
BAG SPEC THK2 15X12 ZIP CLS (MISCELLANEOUS) ×1
BAG ZIPLOCK 12X15 (MISCELLANEOUS) ×3 IMPLANT
BANDAGE ELASTIC 4 VELCRO ST LF (GAUZE/BANDAGES/DRESSINGS) ×3 IMPLANT
BANDAGE ESMARK 6X9 LF (GAUZE/BANDAGES/DRESSINGS) ×1 IMPLANT
BNDG CMPR 9X6 STRL LF SNTH (GAUZE/BANDAGES/DRESSINGS) ×1
BNDG COHESIVE 1X5 TAN STRL LF (GAUZE/BANDAGES/DRESSINGS) ×3 IMPLANT
BNDG ESMARK 6X9 LF (GAUZE/BANDAGES/DRESSINGS) ×3
BNDG GAUZE ELAST 4 BULKY (GAUZE/BANDAGES/DRESSINGS) ×3 IMPLANT
CORDS BIPOLAR (ELECTRODE) ×3 IMPLANT
CUFF TOURN SGL QUICK 18 (TOURNIQUET CUFF) ×3 IMPLANT
CUFF TOURN SGL QUICK 24 (TOURNIQUET CUFF)
CUFF TRNQT CYL 24X4X40X1 (TOURNIQUET CUFF) IMPLANT
DRAIN PENROSE 18X1/2 LTX STRL (DRAIN) ×3 IMPLANT
DRSG PAD ABDOMINAL 8X10 ST (GAUZE/BANDAGES/DRESSINGS) ×3 IMPLANT
ELECT REM PT RETURN 9FT ADLT (ELECTROSURGICAL) ×3
ELECTRODE REM PT RTRN 9FT ADLT (ELECTROSURGICAL) ×1 IMPLANT
EVACUATOR 1/8 PVC DRAIN (DRAIN) ×3 IMPLANT
GAUZE IODOFORM PACK 1/2 7832 (GAUZE/BANDAGES/DRESSINGS) ×3 IMPLANT
GAUZE PACKING IODOFORM 1/4X15 (GAUZE/BANDAGES/DRESSINGS) ×3 IMPLANT
GAUZE SPONGE 4X4 12PLY STRL (GAUZE/BANDAGES/DRESSINGS) ×3 IMPLANT
GAUZE XEROFORM 5X9 LF (GAUZE/BANDAGES/DRESSINGS) ×3 IMPLANT
GOWN STRL REUS W/TWL LRG LVL3 (GOWN DISPOSABLE) ×3 IMPLANT
HANDPIECE INTERPULSE COAX TIP (DISPOSABLE) ×3
IV NS IRRIG 3000ML ARTHROMATIC (IV SOLUTION) ×3 IMPLANT
KIT BASIN OR (CUSTOM PROCEDURE TRAY) ×3 IMPLANT
PACK ORTHO EXTREMITY (CUSTOM PROCEDURE TRAY) ×3 IMPLANT
PAD CAST 4YDX4 CTTN HI CHSV (CAST SUPPLIES) ×1 IMPLANT
PADDING CAST COTTON 4X4 STRL (CAST SUPPLIES) ×3
POSITIONER SURGICAL ARM (MISCELLANEOUS) ×3 IMPLANT
SET CYSTO W/LG BORE CLAMP LF (SET/KITS/TRAYS/PACK) ×3 IMPLANT
SET HNDPC FAN SPRY TIP SCT (DISPOSABLE) ×1 IMPLANT
SUT ETHILON 4 0 PS 2 18 (SUTURE) ×3 IMPLANT
SYR CONTROL 10ML LL (SYRINGE) ×3 IMPLANT
TOWEL OR 17X26 10 PK STRL BLUE (TOWEL DISPOSABLE) ×3 IMPLANT

## 2014-10-10 NOTE — Op Note (Signed)
See note [phone]
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
## 2014-10-10 NOTE — ED Notes (Signed)
Pt c/o right fourth finger swelling and pain, purple discoloration to fingernail. Pt c/o total loss of sensation to tip of finger, sensation present after first joint. Pt states swelling began today, denies any trauma other than picking off hangnail last night. Pt states he is immunocompromised due to [employer], states he had similar episode that lead to bone infection in less than one day and required surgery.

## 2014-10-10 NOTE — ED Notes (Signed)
CHG bath performed, all pt belongings in bag and given to visitor, consent form signed.  Pt transported to OR.

## 2014-10-10 NOTE — Anesthesia Postprocedure Evaluation (Signed)
  Anesthesia Post-op Note  Patient: Brett Davis  Procedure(s) Performed: Procedure(s): IRRIGATION AND DEBRIDEMENT EXTREMITY RIGHT RING FINGER (Right)  Patient Location: PACU  Anesthesia Type:General  Level of Consciousness: awake and alert   Airway and Oxygen Therapy: Patient Spontanous Breathing  Post-op Pain: none  Post-op Assessment: Post-op Vital signs reviewed  Post-op Vital Signs: Reviewed  Last Vitals:  Filed Vitals:   10/10/14 1911  BP: 148/84  Pulse: 83  Temp: 36.8 C  Resp: 10    Complications: No apparent anesthesia complications
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
## 2014-10-10 NOTE — Progress Notes (Signed)
PACU Nursing Note: Discharge Note: Pt alert and oriented x 4, gait steady, VSS, denies any pain or discomfort at surgical site, dsg CDI, pt tolerating PO fluids well. DC teaching done w/ pt and significant other. Pt teaching done re: (1) importance of keeping surgical site dsg CDI, (2) importance of taking PO abx as prescribed by MD and to complete all of abx prescribed unless otherwise directed by the attending MD. (3) rest x 24 hours per anesthesia guidelines (4) safety while taking PO narcotics for pain (5) post op diet, to advance as tolerated (6) to keep rt hand surgical site elevated this PM (7) when to call attending MD, i.e. Fever, drainage, unrelieved pain etc (8) to call in am for follow up MD appt and importance of keeping that appointment with primary MD. Pt tolerating PO fluids, able to stand and dress self w/o assistance. All above pt teaching done with demonstration, explaination and via Teach Back method with many opportunities for questions and or clarification of instructions as outlined by MD and Anesthesia Team. Pt escorted with significant other to exit, via w/c, pt assisted into private vehicle, prior to departure, opportunity for questions again provided to pt and significant other. (pt given copy of dc instructions and two prescriptions from MD)  Gaetano Net, BSN, RN, RRT, CPAN, CCRN

## 2014-10-10 NOTE — ED Notes (Signed)
Pfeiffer, MD, made aware of patient's presentation, including lack of sensation to distal fingertip.

## 2014-10-10 NOTE — ED Notes (Signed)
Nurse currently starting IV 

## 2014-10-10 NOTE — Transfer of Care (Signed)
Immediate Anesthesia Transfer of Care Note  Patient: Brett Davis  Procedure(s) Performed: Procedure(s): IRRIGATION AND DEBRIDEMENT EXTREMITY RIGHT RING FINGER (Right)  Patient Location: PACU  Anesthesia Type:General  Level of Consciousness: awake, alert  and oriented  Airway & Oxygen Therapy: Patient Spontanous Breathing and Patient connected to face mask oxygen  Post-op Assessment: Report given to RN and Post -op Vital signs reviewed and stable  Post vital signs: Reviewed and stable  Last Vitals:  Filed Vitals:   10/10/14 1818  BP: 164/90  Pulse: 85  Temp:   Resp: 17    Complications: No apparent anesthesia complications
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
## 2014-10-10 NOTE — Anesthesia Preprocedure Evaluation (Signed)
Anesthesia Evaluation  Patient identified by MRN, date of birth, ID band Patient awake    Reviewed: Allergy & Precautions, NPO status , Patient's Chart, lab work & pertinent test results  Airway Mallampati: II  TM Distance: >3 FB Neck ROM: Full    Dental no notable dental hx.    Pulmonary COPDCurrent Smoker,  breath sounds clear to auscultation  Pulmonary exam normal       Cardiovascular hypertension, Pt. on medications + CAD, + Past MI and + Cardiac Stents Rhythm:Regular Rate:Normal     Neuro/Psych negative neurological ROS     GI/Hepatic Neg liver ROS, hiatal hernia, GERD-  ,  Endo/Other  negative endocrine ROS  Renal/GU negative Renal ROS  negative genitourinary   Musculoskeletal  (+) Arthritis -,   Abdominal   Peds negative pediatric ROS (+)  Hematology negative hematology ROS (+)   Anesthesia Other Findings   Reproductive/Obstetrics negative OB ROS                             Anesthesia Physical  Anesthesia Plan  ASA: III  Anesthesia Plan: General   Post-op Pain Management:    Induction: Intravenous  Airway Management Planned: LMA  Additional Equipment:   Intra-op Plan:   Post-operative Plan: Extubation in OR  Informed Consent: I have reviewed the patients History and Physical, chart, labs and discussed the procedure including the risks, benefits and alternatives for the proposed anesthesia with the patient or authorized representative who has indicated his/her understanding and acceptance.   Dental advisory given  Plan Discussed with: CRNA and Surgeon  Anesthesia Plan Comments:         Anesthesia Quick Evaluation

## 2014-10-11 ENCOUNTER — Encounter (HOSPITAL_COMMUNITY): Payer: Self-pay | Admitting: Orthopedic Surgery

## 2014-10-11 NOTE — Op Note (Signed)
NAME:  Brett Davis, Brett Davis NO.:  [id number]  MEDICAL RECORD NO.:  [id number]  LOCATION:                                 FACILITY:  PHYSICIAN:  Artist Pais. Zniya Cottone, M.D.DATE OF BIRTH:  Dec 06, 1965  DATE OF PROCEDURE:  10/10/2014 DATE OF DISCHARGE:  10/10/2014                              OPERATIVE REPORT   PREOPERATIVE DIAGNOSIS:  Complex infection, right ring finger paronychia/felon.  POSTOPERATIVE DIAGNOSIS:  Complex infection, right ring finger paronychia/felon.  PROCEDURE:  Incision and drainage above with open packing.  SURGEON:  Artist Pais. Mina Marble, M.D.  ASSISTANT:  None.  ANESTHESIA:  General.  No complication.  No drains.  The patient was taken to the operating suite.  After induction of adequate general anesthetic, right upper extremity was prepped and draped in sterile fashion.  An Esmarch was used to exsanguinate the limb.  Tourniquet was inflated to 250 mmHg.  At this point in time, I made a mid lateral incision over the ulnar side of the distal phalanx and the DIP joint, incised and sharply dissected down to the septa. There was some cloudy fluid.  This was cultured aerobic, anaerobic, stat Gram stain.  Also lifted the ulnar 20% of the nail off the eponychial fold and purulence was encountered.  It was cultured as well.  Both wounds were thoroughly irrigated with 500 mL normal saline, packed open with quarter-inch Iodoform gauze.  The 4x4s, fluffs, and a Coban wrap were applied.  The patient tolerated the procedure well in a concealed fashion.     Artist Pais Mina Marble, M.D.     MAW/MEDQ  D:  10/10/2014  T:  10/11/2014  Job:  [id number]

## 2014-10-13 LAB — CULTURE, ROUTINE-ABSCESS

## 2014-10-14 LAB — CULTURE, ROUTINE-ABSCESS: Culture: NO GROWTH

## 2014-10-15 LAB — ANAEROBIC CULTURE

## 2014-10-16 LAB — CULTURE, BLOOD (ROUTINE X 2)
CULTURE: NO GROWTH
CULTURE: NO GROWTH

## 2015-03-28 ENCOUNTER — Other Ambulatory Visit: Payer: Self-pay | Admitting: Pain Medicine

## 2015-03-28 DIAGNOSIS — M545 Low back pain: Secondary | ICD-10-CM

## 2015-03-31 ENCOUNTER — Ambulatory Visit: Payer: Self-pay | Admitting: Cardiology

## 2015-04-17 ENCOUNTER — Other Ambulatory Visit: Payer: Self-pay | Admitting: Pain Medicine

## 2015-04-26 ENCOUNTER — Ambulatory Visit
Admission: RE | Admit: 2015-04-26 | Discharge: 2015-04-26 | Disposition: A | Discharge status: Home / Self Care | Payer: BLUE CROSS/BLUE SHIELD | Source: Ambulatory Visit | Attending: Pain Medicine | Admitting: Pain Medicine

## 2015-04-26 DIAGNOSIS — M545 Low back pain: Secondary | ICD-10-CM

## 2015-05-23 ENCOUNTER — Ambulatory Visit: Payer: BLUE CROSS/BLUE SHIELD | Attending: Rheumatology

## 2015-05-23 DIAGNOSIS — M545 Low back pain, unspecified: Secondary | ICD-10-CM
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
## 2015-07-10 ENCOUNTER — Emergency Department (HOSPITAL_COMMUNITY): Payer: BLUE CROSS/BLUE SHIELD

## 2015-07-10 ENCOUNTER — Emergency Department (HOSPITAL_COMMUNITY)
Admission: EM | Admit: 2015-07-10 | Discharge: 2015-07-10 | Disposition: A | Discharge status: Home / Self Care | Payer: BLUE CROSS/BLUE SHIELD | Attending: Emergency Medicine | Admitting: Emergency Medicine

## 2015-07-10 ENCOUNTER — Telehealth: Payer: Self-pay | Admitting: *Deleted

## 2015-07-10 ENCOUNTER — Encounter (HOSPITAL_COMMUNITY): Payer: Self-pay | Admitting: Emergency Medicine

## 2015-07-10 DIAGNOSIS — Z955 Presence of coronary angioplasty implant and graft: Secondary | ICD-10-CM | POA: Diagnosis not present

## 2015-07-10 DIAGNOSIS — Z79899 Other long term (current) drug therapy: Secondary | ICD-10-CM | POA: Insufficient documentation

## 2015-07-10 DIAGNOSIS — R079 Chest pain, unspecified: Secondary | ICD-10-CM

## 2015-07-10 DIAGNOSIS — F329 Major depressive disorder, single episode, unspecified: Secondary | ICD-10-CM | POA: Diagnosis not present

## 2015-07-10 DIAGNOSIS — F17210 Nicotine dependence, cigarettes, uncomplicated: Secondary | ICD-10-CM | POA: Insufficient documentation

## 2015-07-10 DIAGNOSIS — E785 Hyperlipidemia, unspecified: Secondary | ICD-10-CM | POA: Insufficient documentation

## 2015-07-10 DIAGNOSIS — Z8781 Personal history of (healed) traumatic fracture: Secondary | ICD-10-CM | POA: Insufficient documentation

## 2015-07-10 DIAGNOSIS — Z8719 Personal history of other diseases of the digestive system: Secondary | ICD-10-CM | POA: Diagnosis not present

## 2015-07-10 DIAGNOSIS — I10 Essential (primary) hypertension: Secondary | ICD-10-CM | POA: Diagnosis not present

## 2015-07-10 DIAGNOSIS — Z9889 Other specified postprocedural states: Secondary | ICD-10-CM | POA: Insufficient documentation

## 2015-07-10 DIAGNOSIS — F429 Obsessive-compulsive disorder, unspecified: Secondary | ICD-10-CM | POA: Diagnosis not present

## 2015-07-10 DIAGNOSIS — M1990 Unspecified osteoarthritis, unspecified site: Secondary | ICD-10-CM | POA: Insufficient documentation

## 2015-07-10 DIAGNOSIS — I252 Old myocardial infarction: Secondary | ICD-10-CM | POA: Diagnosis not present

## 2015-07-10 DIAGNOSIS — Z7982 Long term (current) use of aspirin: Secondary | ICD-10-CM | POA: Diagnosis not present

## 2015-07-10 DIAGNOSIS — F419 Anxiety disorder, unspecified: Secondary | ICD-10-CM | POA: Diagnosis not present

## 2015-07-10 DIAGNOSIS — I2510 Atherosclerotic heart disease of native coronary artery without angina pectoris: Secondary | ICD-10-CM | POA: Diagnosis not present

## 2015-07-10 DIAGNOSIS — L089 Local infection of the skin and subcutaneous tissue, unspecified: Secondary | ICD-10-CM | POA: Insufficient documentation

## 2015-07-10 DIAGNOSIS — Z86010 Personal history of colonic polyps: Secondary | ICD-10-CM | POA: Insufficient documentation

## 2015-07-10 DIAGNOSIS — J441 Chronic obstructive pulmonary disease with (acute) exacerbation: Secondary | ICD-10-CM | POA: Insufficient documentation

## 2015-07-10 DIAGNOSIS — Z87438 Personal history of other diseases of male genital organs: Secondary | ICD-10-CM | POA: Insufficient documentation

## 2015-07-10 DIAGNOSIS — M79676 Pain in unspecified toe(s): Secondary | ICD-10-CM | POA: Diagnosis present

## 2015-07-10 LAB — CBC
HEMATOCRIT: 44.6 % (ref 39.0–52.0)
Hemoglobin: 15.1 g/dL (ref 13.0–17.0)
MCH: 34.6 pg — AB (ref 26.0–34.0)
MCHC: 33.9 g/dL (ref 30.0–36.0)
MCV: 102.3 fL — ABNORMAL HIGH (ref 78.0–100.0)
Platelets: 334 10*3/uL (ref 150–400)
RBC: 4.36 MIL/uL (ref 4.22–5.81)
RDW: 13 % (ref 11.5–15.5)
WBC: 15.6 10*3/uL — ABNORMAL HIGH (ref 4.0–10.5)

## 2015-07-10 LAB — BASIC METABOLIC PANEL
ANION GAP: 10 (ref 5–15)
BUN: 11 mg/dL (ref 6–20)
CALCIUM: 9.2 mg/dL (ref 8.9–10.3)
CO2: 24 mmol/L (ref 22–32)
Chloride: 105 mmol/L (ref 101–111)
Creatinine, Ser: 1.14 mg/dL (ref 0.61–1.24)
Glucose, Bld: 173 mg/dL — ABNORMAL HIGH (ref 65–99)
POTASSIUM: 3.6 mmol/L (ref 3.5–5.1)
SODIUM: 139 mmol/L (ref 135–145)

## 2015-07-10 LAB — I-STAT TROPONIN, ED
Troponin i, poc: 0.01 ng/mL (ref 0.00–0.08)
Troponin i, poc: 0.01 ng/mL (ref 0.00–0.08)

## 2015-07-10 MED ORDER — IPRATROPIUM BROMIDE 0.02 % IN SOLN
0.5000 mg | Freq: Once | RESPIRATORY_TRACT | Status: AC
  Administered 2015-07-10: 0.5 mg via RESPIRATORY_TRACT
  Filled 2015-07-10: qty 2.5

## 2015-07-10 MED ORDER — PREDNISONE 10 MG (21) PO TBPK: 40.0000 mg | ORAL_TABLET | Freq: Every day | ORAL | Status: DC

## 2015-07-10 MED ORDER — ASPIRIN 81 MG PO CHEW
324.0000 mg | CHEWABLE_TABLET | Freq: Once | ORAL | Status: AC
  Administered 2015-07-10: 324 mg via ORAL
  Filled 2015-07-10: qty 4

## 2015-07-10 MED ORDER — METHYLPREDNISOLONE SODIUM SUCC 125 MG IJ SOLR
60.0000 mg | Freq: Once | INTRAMUSCULAR | Status: AC
  Administered 2015-07-10: 60 mg via INTRAVENOUS
  Filled 2015-07-10: qty 2

## 2015-07-10 MED ORDER — ALBUTEROL SULFATE (2.5 MG/3ML) 0.083% IN NEBU
2.5000 mg | INHALATION_SOLUTION | Freq: Once | RESPIRATORY_TRACT | Status: AC
  Administered 2015-07-10: 2.5 mg via RESPIRATORY_TRACT
  Filled 2015-07-10: qty 3

## 2015-07-10 MED ORDER — DOXYCYCLINE HYCLATE 100 MG PO TABS
100.0000 mg | ORAL_TABLET | Freq: Once | ORAL | Status: AC
  Administered 2015-07-10: 100 mg via ORAL
  Filled 2015-07-10: qty 1

## 2015-07-10 MED ORDER — DOXYCYCLINE HYCLATE 100 MG PO CAPS: 100.0000 mg | ORAL_CAPSULE | Freq: Two times a day (BID) | ORAL | Status: DC

## 2015-07-10 MED ORDER — ALBUTEROL SULFATE (2.5 MG/3ML) 0.083% IN NEBU: 2.5000 mg | INHALATION_SOLUTION | Freq: Four times a day (QID) | RESPIRATORY_TRACT | Status: AC | PRN

## 2015-07-10 NOTE — Discharge Instructions (Signed)
Nonspecific Chest Pain  °Chest pain can be caused by many different conditions. There is always a chance that your pain could be related to something serious, such as a heart attack or a blood clot in your lungs. Chest pain can also be caused by conditions that are not life-threatening. If you have chest pain, it is very important to follow up with your health care provider. °CAUSES  °Chest pain can be caused by: °· Heartburn. °· Pneumonia or bronchitis. °· Anxiety or stress. °· Inflammation around your heart (pericarditis) or lung (pleuritis or pleurisy). °· A blood clot in your lung. °· A collapsed lung (pneumothorax). It can develop suddenly on its own (spontaneous pneumothorax) or from trauma to the chest. °· Shingles infection (varicella-zoster virus). °· Heart attack. °· Damage to the bones, muscles, and cartilage that make up your chest wall. This can include: °¨ Bruised bones due to injury. °¨ Strained muscles or cartilage due to frequent or repeated coughing or overwork. °¨ Fracture to one or more ribs. °¨ Sore cartilage due to inflammation (costochondritis). °RISK FACTORS  °Risk factors for chest pain may include: °· Activities that increase your risk for trauma or injury to your chest. °· Respiratory infections or conditions that cause frequent coughing. °· Medical conditions or overeating that can cause heartburn. °· Heart disease or family history of heart disease. °· Conditions or health behaviors that increase your risk of developing a blood clot. °· Having had chicken pox (varicella zoster). °SIGNS AND SYMPTOMS °Chest pain can feel like: °· Burning or tingling on the surface of your chest or deep in your chest. °· Crushing, pressure, aching, or squeezing pain. °· Dull or sharp pain that is worse when you move, cough, or take a deep breath. °· Pain that is also felt in your back, neck, shoulder, or arm, or pain that spreads to any of these areas. °Your chest pain may come and go, or it may stay  constant. °DIAGNOSIS °Lab tests or other studies may be needed to find the cause of your pain. Your health care provider may have you take a test called an ambulatory ECG (electrocardiogram). An ECG records your heartbeat patterns at the time the test is performed. You may also have other tests, such as: °· Transthoracic echocardiogram (TTE). During echocardiography, sound waves are used to create a picture of all of the heart structures and to look at how blood flows through your heart. °· Transesophageal echocardiogram (TEE). This is a more advanced imaging test that obtains images from inside your body. It allows your health care provider to see your heart in finer detail. °· Cardiac monitoring. This allows your health care provider to monitor your heart rate and rhythm in real time. °· Holter monitor. This is a portable device that records your heartbeat and can help to diagnose abnormal heartbeats. It allows your health care provider to track your heart activity for several days, if needed. °· Stress tests. These can be done through exercise or by taking medicine that makes your heart beat more quickly. °· Blood tests. °· Imaging tests. °TREATMENT  °Your treatment depends on what is causing your chest pain. Treatment may include: °· Medicines. These may include: °¨ Acid blockers for heartburn. °¨ Anti-inflammatory medicine. °¨ Pain medicine for inflammatory conditions. °¨ Antibiotic medicine, if an infection is present. °¨ Medicines to dissolve blood clots. °¨ Medicines to treat coronary artery disease. °· Supportive care for conditions that do not require medicines. This may include: °¨ Resting. °¨ Applying heat   or cold packs to injured areas. °¨ Limiting activities until pain decreases. °HOME CARE INSTRUCTIONS °· If you were prescribed an antibiotic medicine, finish it all even if you start to feel better. °· Avoid any activities that bring on chest pain. °· Do not use any tobacco products, including  cigarettes, chewing tobacco, or electronic cigarettes. If you need help quitting, ask your health care provider. °· Do not drink alcohol. °· Take medicines only as directed by your health care provider. °· Keep all follow-up visits as directed by your health care provider. This is important. This includes any further testing if your chest pain does not go away. °· If heartburn is the cause for your chest pain, you may be told to keep your head raised (elevated) while sleeping. This reduces the chance that acid will go from your stomach into your esophagus. °· Make lifestyle changes as directed by your health care provider. These may include: °¨ Getting regular exercise. Ask your health care provider to suggest some activities that are safe for you. °¨ Eating a heart-healthy diet. A registered dietitian can help you to learn healthy eating options. °¨ Maintaining a healthy weight. °¨ Managing diabetes, if necessary. °¨ Reducing stress. °SEEK MEDICAL CARE IF: °· Your chest pain does not go away after treatment. °· You have a rash with blisters on your chest. °· You have a fever. °SEEK IMMEDIATE MEDICAL CARE IF:  °· Your chest pain is worse. °· You have an increasing cough, or you cough up blood. °· You have severe abdominal pain. °· You have severe weakness. °· You faint. °· You have chills. °· You have sudden, unexplained chest discomfort. °· You have sudden, unexplained discomfort in your arms, back, neck, or jaw. °· You have shortness of breath at any time. °· You suddenly start to sweat, or your skin gets clammy. °· You feel nauseous or you vomit. °· You suddenly feel light-headed or dizzy. °· Your heart begins to beat quickly, or it feels like it is skipping beats. °These symptoms may represent a serious problem that is an emergency. Do not wait to see if the symptoms will go away. Get medical help right away. Call your local emergency services (911 in the [place] Do not drive yourself to the hospital. °  °This  information is not intended to replace advice given to you by your health care provider. Make sure you discuss any questions you have with your health care provider. °  °Document Released: 05/01/2005 Document Revised: 08/12/2014 Document Reviewed: 02/25/2014 °Elsevier Interactive Patient Education ©2016 [employer] ° °

## 2015-07-10 NOTE — ED Notes (Signed)
Pt c/o toe pain and SOB.  States that he takes remicaid for RA which lowers his immune system.  During triage, pt began to be diaphoretic and began vomiting, c/o worsening SOB.

## 2015-07-10 NOTE — ED Notes (Signed)
Patient transported to X-ray 
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
## 2015-07-10 NOTE — ED Notes (Signed)
Pt c/o right big toe pain that is possibly infected.  Pt also has two infected fingers. Pt states that he has had surgery on the fingers before.

## 2015-07-10 NOTE — ED Notes (Signed)
Contacted by [facility] confirming Prednisone prescription.  Confirmed dosing as documented in ED notes.

## 2015-07-13 ENCOUNTER — Encounter (HOSPITAL_BASED_OUTPATIENT_CLINIC_OR_DEPARTMENT_OTHER): Payer: Self-pay

## 2015-07-13 ENCOUNTER — Ambulatory Visit (HOSPITAL_BASED_OUTPATIENT_CLINIC_OR_DEPARTMENT_OTHER)
Admission: RE | Admit: 2015-07-13 | Discharge: 2015-07-13 | Disposition: A | Discharge status: Home / Self Care | Payer: BLUE CROSS/BLUE SHIELD | Source: Ambulatory Visit | Attending: Physician Assistant | Admitting: Physician Assistant

## 2015-07-13 ENCOUNTER — Other Ambulatory Visit (HOSPITAL_BASED_OUTPATIENT_CLINIC_OR_DEPARTMENT_OTHER): Payer: Self-pay | Admitting: Physician Assistant

## 2015-07-13 DIAGNOSIS — R1012 Left upper quadrant pain: Secondary | ICD-10-CM | POA: Insufficient documentation

## 2015-07-13 DIAGNOSIS — K449 Diaphragmatic hernia without obstruction or gangrene: Secondary | ICD-10-CM | POA: Insufficient documentation

## 2015-07-13 DIAGNOSIS — R110 Nausea: Secondary | ICD-10-CM | POA: Insufficient documentation

## 2015-07-13 MED ORDER — IOHEXOL 300 MG/ML  SOLN
100.0000 mL | Freq: Once | INTRAMUSCULAR | Status: AC | PRN
Start: 2015-07-13 — End: 2015-07-13
  Administered 2015-07-13: 100 mL via INTRAVENOUS

## 2015-08-09 ENCOUNTER — Encounter (HOSPITAL_BASED_OUTPATIENT_CLINIC_OR_DEPARTMENT_OTHER): Payer: No Typology Code available for payment source

## 2015-08-10 ENCOUNTER — Other Ambulatory Visit: Payer: Self-pay | Admitting: General Surgery

## 2015-08-10 DIAGNOSIS — K449 Diaphragmatic hernia without obstruction or gangrene: Secondary | ICD-10-CM

## 2015-08-11 ENCOUNTER — Encounter: Payer: Self-pay | Admitting: Cardiology

## 2015-08-11 ENCOUNTER — Ambulatory Visit (INDEPENDENT_AMBULATORY_CARE_PROVIDER_SITE_OTHER): Payer: BLUE CROSS/BLUE SHIELD | Admitting: Cardiology

## 2015-08-11 VITALS — BP 140/98 | HR 82 | Ht 70.0 in | Wt 178.4 lb

## 2015-08-11 DIAGNOSIS — I25118 Atherosclerotic heart disease of native coronary artery with other forms of angina pectoris: Secondary | ICD-10-CM

## 2015-08-11 DIAGNOSIS — I2510 Atherosclerotic heart disease of native coronary artery without angina pectoris: Secondary | ICD-10-CM | POA: Insufficient documentation

## 2015-08-11 DIAGNOSIS — I10 Essential (primary) hypertension: Secondary | ICD-10-CM | POA: Diagnosis not present

## 2015-08-11 DIAGNOSIS — R0683 Snoring: Secondary | ICD-10-CM | POA: Diagnosis not present

## 2015-08-11 DIAGNOSIS — Z720 Tobacco use: Secondary | ICD-10-CM | POA: Diagnosis not present

## 2015-08-11 MED ORDER — ISOSORBIDE MONONITRATE ER 30 MG PO TB24: 15.0000 mg | ORAL_TABLET | Freq: Every day | ORAL | Status: AC

## 2015-08-11 NOTE — Patient Instructions (Signed)
Medication Instructions:  Please start Isosorbide 30 mg 1/2 tablet a day. Continue all other medications as listed.  Testing/Procedures: Your physician has recommended that you have a sleep study. This test records several body functions during sleep, including: brain activity, eye movement, oxygen and carbon dioxide blood levels, heart rate and rhythm, breathing rate and rhythm, the flow of air through your mouth and nose, snoring, body muscle movements, and chest and belly movement.  Follow-Up: Follow up in 3 months with Dr Anne Fu.  If you need a refill on your cardiac medications before your next appointment, please call your pharmacy.  Thank you for choosing [facility]!!

## 2015-08-11 NOTE — Addendum Note (Signed)
Addended by: Reesa Chew on: 08/11/2015 04:55 PM   Modules accepted: Orders
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
## 2015-08-16 ENCOUNTER — Inpatient Hospital Stay: Admission: RE | Admit: 2015-08-16 | Payer: Self-pay | Source: Ambulatory Visit

## 2015-08-21 ENCOUNTER — Other Ambulatory Visit: Payer: Self-pay | Admitting: *Deleted

## 2015-08-21 DIAGNOSIS — G4733 Obstructive sleep apnea (adult) (pediatric): Secondary | ICD-10-CM

## 2015-08-22 ENCOUNTER — Telehealth: Payer: Self-pay | Admitting: Cardiology

## 2015-08-22 NOTE — Telephone Encounter (Signed)
Called patient and his wife back about their questions. Patient wanted to know if he could take Nitro with his new medication Imdur. Informed patient that he can take Nitro with Imdur, but if he is having chest pain with Imdur and Nitro to call 911 or go to ED. Patient wanted to know if he had to take Imdur long term. Informed patient that Imdur is normally taken long term. Patient stated he really did not understand what is going on with his heart. Patient stated he did not get all his questions answered at his visit. Informed patient that  I would send message to Dr. Anne Fu and his nurse to call him, so he can get his questions answered.

## 2015-08-22 NOTE — Telephone Encounter (Signed)
New Message  Pt wife calling to speak w/ RN concerning an explanation for a certain medication- and also if pt needs to continue to take nitro for CP. Please call back and discuss.

## 2015-08-23 NOTE — Telephone Encounter (Signed)
Unable to reach [facility] at the phone # listed - NA and no VM set up. Called and spoke with pt.  I reviewed his Hx of CAD and the reason for the medical treatment with Isosorbide.  He reports understanding now and will c/b with further questions or concerns.  He will f/u with Dr Anne Fu as ordered in 3 months.

## 2015-08-23 NOTE — Telephone Encounter (Signed)
Pam,   Please call him to help answer any further questions. If needed, have him come back to clinic to discuss.    Donato Schultz, MD

## 2015-08-24 ENCOUNTER — Ambulatory Visit
Admission: RE | Admit: 2015-08-24 | Discharge: 2015-08-24 | Disposition: A | Discharge status: Home / Self Care | Payer: BLUE CROSS/BLUE SHIELD | Source: Ambulatory Visit | Attending: General Surgery | Admitting: General Surgery

## 2015-08-24 DIAGNOSIS — K449 Diaphragmatic hernia without obstruction or gangrene: Secondary | ICD-10-CM

## 2015-09-12 ENCOUNTER — Telehealth: Payer: Self-pay | Admitting: Cardiology

## 2015-09-12 NOTE — Telephone Encounter (Signed)
Pt started on Imdur 15mg  08/11/15. Pt states that 3-4 days after starting medication he noticed some places on his arm that looked like "blood blisters".   Pt states they are not painful and do not itch. Pt states something as simple as scratching his arm makes these places come up. 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 Pt unsure if this may be a reaction to the medication. Advised pt that I would route this message to Dr. 10/09/15 for review and advisement.

## 2015-09-12 NOTE — Telephone Encounter (Signed)
New message      Pt c/o medication issue:  1. Name of Medication:  Isosorbide  2. How are you currently taking this medication (dosage and times per day)? 15mg  daily  3. Are you having a reaction (difficulty breathing--STAT)? maybe  4. What is your medication issue?  Since starting medication---pt has what appears to be "blood blisters" on his arms

## 2015-09-13 NOTE — Telephone Encounter (Signed)
Pt's wife calling re not getting a call back and we are almost closed for the day, I explained the nurses still make calls even after the phones go on, she states her husband already has an appt with Dr. Swaziland but not until April, and wants to know why Dr. Anne Fu is "putting him off" on another doctor, I also explained the doctors specialize in different areas and maybe that why he referred him. pls call [phone]

## 2015-09-13 NOTE — Telephone Encounter (Signed)
Spoke with wife and pt for greater than 30 mins about the patient's condition and medical history.  Pt and wife both state concern about his care and the possibility of needing further surgery (hernia repair TBS).  Advised them Dr Anne Fu has referred him to be seen by Dr Swaziland and/or Dr Eldridge Dace for the evaluation of his CAD and possible need for CTO PCI that either one of them do.  Advised this is not a procedure Dr Anne Fu does and he is not "putting him off."  They are upset about Dr Elvis Coil 1st available appt time but state they do not want to see Dr Eldridge Dace because they have already talked to Dr Emilio Aspen and that's who he recommended.  Advised I will look into the first available with Dr Swaziland however I do not have any control of his schedule or how soon he can be seen.  Advised I will call back tomorrow with further information.

## 2015-09-13 NOTE — Telephone Encounter (Signed)
Called back to patient's wife, voicemail.  No message left.  NO DPR ON FILE  Left voice mail for patient to call back.

## 2015-09-13 NOTE — Telephone Encounter (Signed)
Please set him up with consultation with either Dr. Swaziland or Dr. Eldridge Dace to discuss CTO PCI.   Donato Schultz, MD

## 2015-09-14 NOTE — Telephone Encounter (Signed)
Returned call to patient spoke to wife.Appointment moved up with Dr.Jordan to 09/19/15 at 11:30 am.

## 2015-09-14 NOTE — Telephone Encounter (Signed)
Appreciate the time spent to explain treatment plan.  Ultimately, trying to give him anginal relief.  He has CTO. Trying to get him in the right hands to assess potential CTO PCI. Appreciate looking at Dr. Elvis Coil sched.  Donato Schultz, MD

## 2015-09-14 NOTE — Telephone Encounter (Signed)
Left message for pt on vm that Dr Anne Fu discussed the situation with Dr Swaziland and someone from Dr Elvis Coil office will be contacting him to move his appt up.  Advised if he has questions he should call back or call to Dr Elvis Coil office to ask about a work-in appt.

## 2015-09-19 ENCOUNTER — Encounter: Payer: Self-pay | Admitting: Cardiology

## 2015-09-19 ENCOUNTER — Ambulatory Visit (INDEPENDENT_AMBULATORY_CARE_PROVIDER_SITE_OTHER): Payer: BLUE CROSS/BLUE SHIELD | Admitting: Cardiology

## 2015-09-19 ENCOUNTER — Other Ambulatory Visit: Payer: Self-pay | Admitting: Cardiology

## 2015-09-19 ENCOUNTER — Telehealth: Payer: Self-pay | Admitting: Cardiology

## 2015-09-19 VITALS — BP 182/94 | HR 85 | Ht 70.0 in | Wt 182.8 lb

## 2015-09-19 DIAGNOSIS — E7800 Pure hypercholesterolemia, unspecified: Secondary | ICD-10-CM | POA: Insufficient documentation

## 2015-09-19 DIAGNOSIS — I739 Peripheral vascular disease, unspecified: Secondary | ICD-10-CM

## 2015-09-19 DIAGNOSIS — I25110 Atherosclerotic heart disease of native coronary artery with unstable angina pectoris: Secondary | ICD-10-CM | POA: Diagnosis not present

## 2015-09-19 DIAGNOSIS — I10 Essential (primary) hypertension: Secondary | ICD-10-CM

## 2015-09-19 HISTORY — DX: Peripheral vascular disease, unspecified: I73.9

## 2015-09-19 NOTE — Telephone Encounter (Signed)
Follow Up  Pt called again to follow Up on scheduling the procedure

## 2015-09-19 NOTE — Patient Instructions (Signed)
We will schedule you for a cardiac cath with possible intervention.  Continue your current medication
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
## 2015-09-19 NOTE — Telephone Encounter (Signed)
Tuesday,09-26-15,will be a good day for his procedure.

## 2015-09-20 NOTE — Telephone Encounter (Signed)
Returned call to patient 09/19/15.Cardiac cath scheduled with Dr.Jordan 09/26/15 at 7:30 am Arrive at 5:30 am.Instructions reviewed with patient.He verbalized understanding.He will have pre cath lab done 09/21/15.Instructions mailed to patient. Post hospital appointment scheduled with Dr.Jordan 10/25/15 at 2:45 pm.

## 2015-09-21 LAB — CBC WITH DIFFERENTIAL/PLATELET
BASOS PCT: 0 % (ref 0–1)
Basophils Absolute: 0 10*3/uL (ref 0.0–0.1)
EOS ABS: 0.3 10*3/uL (ref 0.0–0.7)
EOS PCT: 2 % (ref 0–5)
HCT: 45 % (ref 39.0–52.0)
HEMOGLOBIN: 15.3 g/dL (ref 13.0–17.0)
Lymphocytes Relative: 27 % (ref 12–46)
Lymphs Abs: 4.3 10*3/uL — ABNORMAL HIGH (ref 0.7–4.0)
MCH: 33.8 pg (ref 26.0–34.0)
MCHC: 34 g/dL (ref 30.0–36.0)
MCV: 99.6 fL (ref 78.0–100.0)
MONO ABS: 1.1 10*3/uL — AB (ref 0.1–1.0)
MPV: 8.8 fL (ref 8.6–12.4)
Monocytes Relative: 7 % (ref 3–12)
NEUTROS ABS: 10.1 10*3/uL — AB (ref 1.7–7.7)
Neutrophils Relative %: 64 % (ref 43–77)
PLATELETS: 367 10*3/uL (ref 150–400)
RBC: 4.52 MIL/uL (ref 4.22–5.81)
RDW: 14.6 % (ref 11.5–15.5)
WBC: 15.8 10*3/uL — AB (ref 4.0–10.5)

## 2015-09-21 LAB — COMPREHENSIVE METABOLIC PANEL
ALBUMIN: 4.2 g/dL (ref 3.6–5.1)
ALT: 6 U/L — ABNORMAL LOW (ref 9–46)
AST: 11 U/L (ref 10–40)
Alkaline Phosphatase: 50 U/L (ref 40–115)
BILIRUBIN TOTAL: 0.5 mg/dL (ref 0.2–1.2)
BUN: 8 mg/dL (ref 7–25)
CO2: 28 mmol/L (ref 20–31)
CREATININE: 1 mg/dL (ref 0.60–1.35)
Calcium: 9.8 mg/dL (ref 8.6–10.3)
Chloride: 107 mmol/L (ref 98–110)
GLUCOSE: 89 mg/dL (ref 65–99)
Potassium: 4.3 mmol/L (ref 3.5–5.3)
SODIUM: 143 mmol/L (ref 135–146)
Total Protein: 7.1 g/dL (ref 6.1–8.1)

## 2015-09-21 LAB — LIPID PANEL
Cholesterol: 207 mg/dL — ABNORMAL HIGH (ref 125–200)
HDL: 34 mg/dL — AB (ref >=40)
LDL CALC: 137 mg/dL — AB (ref <130)
Total CHOL/HDL Ratio: 6.1 Ratio — ABNORMAL HIGH (ref <=5.0)
Triglycerides: 180 mg/dL — ABNORMAL HIGH (ref <150)
VLDL: 36 mg/dL — ABNORMAL HIGH (ref <30)

## 2015-09-21 LAB — PROTIME-INR
INR: 0.93 (ref <1.50)
PROTHROMBIN TIME: 12.6 s (ref 11.6–15.2)

## 2015-09-22 ENCOUNTER — Other Ambulatory Visit: Payer: Self-pay

## 2015-09-22 DIAGNOSIS — E785 Hyperlipidemia, unspecified: Secondary | ICD-10-CM

## 2015-09-22 MED ORDER — ATORVASTATIN CALCIUM 80 MG PO TABS: 80.0000 mg | ORAL_TABLET | Freq: Every day | ORAL | Status: AC

## 2015-09-26 ENCOUNTER — Other Ambulatory Visit: Payer: Self-pay | Admitting: Cardiology

## 2015-09-26 ENCOUNTER — Encounter (HOSPITAL_COMMUNITY): Payer: Self-pay | Admitting: Cardiology

## 2015-09-26 ENCOUNTER — Other Ambulatory Visit: Payer: Self-pay | Admitting: Physician Assistant

## 2015-09-26 ENCOUNTER — Ambulatory Visit (HOSPITAL_COMMUNITY)
Admission: RE | Admit: 2015-09-26 | Discharge: 2015-09-26 | Disposition: A | Discharge status: Home / Self Care | Payer: BLUE CROSS/BLUE SHIELD | Source: Ambulatory Visit | Attending: Cardiology | Admitting: Cardiology

## 2015-09-26 ENCOUNTER — Encounter (HOSPITAL_COMMUNITY)
Admission: RE | Disposition: A | Discharge status: Home / Self Care | Payer: Self-pay | Source: Ambulatory Visit | Attending: Cardiology

## 2015-09-26 DIAGNOSIS — F419 Anxiety disorder, unspecified: Secondary | ICD-10-CM | POA: Diagnosis not present

## 2015-09-26 DIAGNOSIS — R42 Dizziness and giddiness: Secondary | ICD-10-CM | POA: Insufficient documentation

## 2015-09-26 DIAGNOSIS — K4090 Unilateral inguinal hernia, without obstruction or gangrene, not specified as recurrent: Secondary | ICD-10-CM | POA: Diagnosis not present

## 2015-09-26 DIAGNOSIS — M1990 Unspecified osteoarthritis, unspecified site: Secondary | ICD-10-CM | POA: Diagnosis not present

## 2015-09-26 DIAGNOSIS — R072 Precordial pain: Secondary | ICD-10-CM | POA: Diagnosis present

## 2015-09-26 DIAGNOSIS — E7800 Pure hypercholesterolemia, unspecified: Secondary | ICD-10-CM | POA: Insufficient documentation

## 2015-09-26 DIAGNOSIS — Z951 Presence of aortocoronary bypass graft: Secondary | ICD-10-CM | POA: Insufficient documentation

## 2015-09-26 DIAGNOSIS — K219 Gastro-esophageal reflux disease without esophagitis: Secondary | ICD-10-CM | POA: Diagnosis not present

## 2015-09-26 DIAGNOSIS — I10 Essential (primary) hypertension: Secondary | ICD-10-CM | POA: Insufficient documentation

## 2015-09-26 DIAGNOSIS — K449 Diaphragmatic hernia without obstruction or gangrene: Secondary | ICD-10-CM | POA: Diagnosis not present

## 2015-09-26 DIAGNOSIS — Z86010 Personal history of colonic polyps: Secondary | ICD-10-CM | POA: Diagnosis not present

## 2015-09-26 DIAGNOSIS — I2510 Atherosclerotic heart disease of native coronary artery without angina pectoris: Secondary | ICD-10-CM | POA: Diagnosis present

## 2015-09-26 DIAGNOSIS — J449 Chronic obstructive pulmonary disease, unspecified: Secondary | ICD-10-CM | POA: Diagnosis not present

## 2015-09-26 DIAGNOSIS — I200 Unstable angina: Secondary | ICD-10-CM | POA: Diagnosis present

## 2015-09-26 DIAGNOSIS — I2582 Chronic total occlusion of coronary artery: Secondary | ICD-10-CM | POA: Insufficient documentation

## 2015-09-26 DIAGNOSIS — E785 Hyperlipidemia, unspecified: Secondary | ICD-10-CM | POA: Diagnosis present

## 2015-09-26 DIAGNOSIS — Z7982 Long term (current) use of aspirin: Secondary | ICD-10-CM | POA: Insufficient documentation

## 2015-09-26 DIAGNOSIS — R0602 Shortness of breath: Secondary | ICD-10-CM

## 2015-09-26 DIAGNOSIS — N529 Male erectile dysfunction, unspecified: Secondary | ICD-10-CM | POA: Insufficient documentation

## 2015-09-26 DIAGNOSIS — I739 Peripheral vascular disease, unspecified: Secondary | ICD-10-CM | POA: Insufficient documentation

## 2015-09-26 DIAGNOSIS — F429 Obsessive-compulsive disorder, unspecified: Secondary | ICD-10-CM | POA: Insufficient documentation

## 2015-09-26 DIAGNOSIS — I25110 Atherosclerotic heart disease of native coronary artery with unstable angina pectoris: Secondary | ICD-10-CM | POA: Insufficient documentation

## 2015-09-26 DIAGNOSIS — F17210 Nicotine dependence, cigarettes, uncomplicated: Secondary | ICD-10-CM | POA: Insufficient documentation

## 2015-09-26 DIAGNOSIS — Z8249 Family history of ischemic heart disease and other diseases of the circulatory system: Secondary | ICD-10-CM | POA: Diagnosis not present

## 2015-09-26 DIAGNOSIS — I252 Old myocardial infarction: Secondary | ICD-10-CM | POA: Insufficient documentation

## 2015-09-26 HISTORY — PX: CARDIAC CATHETERIZATION: SHX172

## 2015-09-26 LAB — POCT ACTIVATED CLOTTING TIME: Activated Clotting Time: 260 seconds

## 2015-09-26 SURGERY — LEFT HEART CATH AND CORONARY ANGIOGRAPHY

## 2015-09-26 MED ORDER — MIDAZOLAM HCL 2 MG/2ML IJ SOLN
INTRAMUSCULAR | Status: AC
  Filled 2015-09-26: qty 2

## 2015-09-26 MED ORDER — LIDOCAINE HCL (PF) 1 % IJ SOLN
INTRAMUSCULAR | Status: AC
  Filled 2015-09-26: qty 30

## 2015-09-26 MED ORDER — ASPIRIN 81 MG PO CHEW: 81.0000 mg | CHEWABLE_TABLET | ORAL | Status: DC

## 2015-09-26 MED ORDER — HEPARIN SODIUM (PORCINE) 1000 UNIT/ML IJ SOLN
INTRAMUSCULAR | Status: DC | PRN
  Administered 2015-09-26 (×2): 4000 [IU] via INTRAVENOUS

## 2015-09-26 MED ORDER — SODIUM CHLORIDE 0.9% FLUSH: 3.0000 mL | INTRAVENOUS | Status: DC | PRN

## 2015-09-26 MED ORDER — ADENOSINE (DIAGNOSTIC) 140MCG/KG/MIN
INTRAVENOUS | Status: DC | PRN
  Administered 2015-09-26: 140 ug/kg/min via INTRAVENOUS

## 2015-09-26 MED ORDER — SODIUM CHLORIDE 0.9 % IV SOLN: 250.0000 mL | INTRAVENOUS | Status: DC | PRN

## 2015-09-26 MED ORDER — ADENOSINE 12 MG/4ML IV SOLN
16.0000 mL | Freq: Once | INTRAVENOUS | Status: DC
  Filled 2015-09-26: qty 16

## 2015-09-26 MED ORDER — FENTANYL CITRATE (PF) 100 MCG/2ML IJ SOLN
INTRAMUSCULAR | Status: DC | PRN
  Administered 2015-09-26 (×2): 25 ug via INTRAVENOUS

## 2015-09-26 MED ORDER — LIDOCAINE HCL (PF) 1 % IJ SOLN
INTRAMUSCULAR | Status: DC | PRN
  Administered 2015-09-26: 5 mL

## 2015-09-26 MED ORDER — SODIUM CHLORIDE 0.9 % WEIGHT BASED INFUSION
3.0000 mL/kg/h | INTRAVENOUS | Status: DC
  Administered 2015-09-26: 3 mL/kg/h via INTRAVENOUS

## 2015-09-26 MED ORDER — FENTANYL CITRATE (PF) 100 MCG/2ML IJ SOLN
INTRAMUSCULAR | Status: AC
  Filled 2015-09-26: qty 2

## 2015-09-26 MED ORDER — TICAGRELOR 90 MG PO TABS
ORAL_TABLET | ORAL | Status: DC | PRN
  Administered 2015-09-26: 180 mg via ORAL

## 2015-09-26 MED ORDER — HEPARIN SODIUM (PORCINE) 1000 UNIT/ML IJ SOLN
INTRAMUSCULAR | Status: AC
  Filled 2015-09-26: qty 1

## 2015-09-26 MED ORDER — SODIUM CHLORIDE 0.9% FLUSH: 3.0000 mL | Freq: Two times a day (BID) | INTRAVENOUS | Status: DC

## 2015-09-26 MED ORDER — CLOPIDOGREL BISULFATE 300 MG PO TABS
ORAL_TABLET | ORAL | Status: AC
  Filled 2015-09-26: qty 2

## 2015-09-26 MED ORDER — HEPARIN (PORCINE) IN NACL 2-0.9 UNIT/ML-% IJ SOLN
INTRAMUSCULAR | Status: DC | PRN
  Administered 2015-09-26: 1000 mL

## 2015-09-26 MED ORDER — TICAGRELOR 90 MG PO TABS
ORAL_TABLET | ORAL | Status: AC
  Filled 2015-09-26: qty 2

## 2015-09-26 MED ORDER — MIDAZOLAM HCL 2 MG/2ML IJ SOLN
INTRAMUSCULAR | Status: DC | PRN
  Administered 2015-09-26: 1 mg via INTRAVENOUS
  Administered 2015-09-26: 2 mg via INTRAVENOUS

## 2015-09-26 MED ORDER — SODIUM CHLORIDE 0.9 % WEIGHT BASED INFUSION: 3.0000 mL/kg/h | INTRAVENOUS | Status: DC

## 2015-09-26 MED ORDER — RABEPRAZOLE SODIUM 20 MG PO TBEC: 20.0000 mg | DELAYED_RELEASE_TABLET | Freq: Every day | ORAL | Status: AC

## 2015-09-26 MED ORDER — ADENOSINE (DIAGNOSTIC) 140MCG/KG/MIN: 140.0000 ug/kg/min | Freq: Once | INTRAVENOUS | Status: DC

## 2015-09-26 MED ORDER — VERAPAMIL HCL 2.5 MG/ML IV SOLN
INTRAVENOUS | Status: AC
  Filled 2015-09-26: qty 2

## 2015-09-26 MED ORDER — SODIUM CHLORIDE 0.9 % WEIGHT BASED INFUSION: 1.0000 mL/kg/h | INTRAVENOUS | Status: DC

## 2015-09-26 MED ORDER — PANTOPRAZOLE SODIUM 40 MG PO TBEC: 40.0000 mg | DELAYED_RELEASE_TABLET | Freq: Every day | ORAL | Status: DC

## 2015-09-26 MED ORDER — VERAPAMIL HCL 2.5 MG/ML IV SOLN
INTRAVENOUS | Status: DC | PRN
  Administered 2015-09-26: 08:00:00 via INTRA_ARTERIAL

## 2015-09-26 MED ORDER — HEPARIN (PORCINE) IN NACL 2-0.9 UNIT/ML-% IJ SOLN
INTRAMUSCULAR | Status: AC
  Filled 2015-09-26: qty 1000

## 2015-09-26 SURGICAL SUPPLY — 19 items
CATH INFINITI 5 FR JL3.5 (CATHETERS) ×4 IMPLANT
CATH INFINITI 5FR ANG PIGTAIL (CATHETERS) ×4 IMPLANT
CATH INFINITI JR4 5F (CATHETERS) ×4 IMPLANT
CATH OPTICROSS 40MHZ (CATHETERS) ×4 IMPLANT
CATH VISTA GUIDE 6FR JR4 (CATHETERS) ×4 IMPLANT
CATH VISTA GUIDE 6FR XBLAD3.5 (CATHETERS) ×4 IMPLANT
DEVICE RAD COMP TR BAND LRG (VASCULAR PRODUCTS) ×4 IMPLANT
GLIDESHEATH SLEND SS 6F .021 (SHEATH) ×4 IMPLANT
GUIDEWIRE PRESSURE COMET II (WIRE) ×4 IMPLANT
KIT ENCORE 26 ADVANTAGE (KITS) ×4 IMPLANT
KIT ESSENTIALS PG (KITS) ×4 IMPLANT
KIT HEART LEFT (KITS) ×4 IMPLANT
PACK CARDIAC CATHETERIZATION (CUSTOM PROCEDURE TRAY) ×4 IMPLANT
SLED PULL BACK IVUS (MISCELLANEOUS) ×4 IMPLANT
SYR MEDRAD MARK V 150ML (SYRINGE) ×4 IMPLANT
TRANSDUCER W/STOPCOCK (MISCELLANEOUS) ×4 IMPLANT
TUBING CIL FLEX 10 FLL-RA (TUBING) ×4 IMPLANT
WIRE ASAHI PROWATER 180CM (WIRE) ×4 IMPLANT
WIRE SAFE-T 1.5MM-J .035X260CM (WIRE) ×4 IMPLANT
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
## 2015-09-26 NOTE — Interval H&P Note (Signed)
History and Physical Interval Note:  09/26/2015 7:30 AM  Glenard Haring  has presented today for surgery, with the diagnosis of cp  The various methods of treatment have been discussed with the patient and family. After consideration of risks, benefits and other options for treatment, the patient has consented to  Procedure(s): Left Heart Cath and Coronary Angiography (N/A) as a surgical intervention .  The patient's history has been reviewed, patient examined, no change in status, stable for surgery.  I have reviewed the patient's chart and labs.  Questions were answered to the patient's satisfaction.   Cath Lab Visit (complete for each Cath Lab visit)  Clinical Evaluation Leading to the Procedure:   ACS: Yes.    Non-ACS:    Anginal Classification: CCS III  Anti-ischemic medical therapy: Maximal Therapy (2 or more classes of medications)  Non-Invasive Test Results: No non-invasive testing performed  Prior CABG: No previous CABG        Theron Arista [facility] 09/26/2015 7:31 AM

## 2015-09-26 NOTE — Discharge Instructions (Addendum)
Start Protonix 40 mg daily for acid reflux   Radial Site Care Refer to this sheet in the next few weeks. These instructions provide you with information about caring for yourself after your procedure. Your health care provider may also give you more specific instructions. Your treatment has been planned according to current medical practices, but problems sometimes occur. Call your health care provider if you have any problems or questions after your procedure. WHAT TO EXPECT AFTER THE PROCEDURE After your procedure, it is typical to have the following:  Bruising at the radial site that usually fades within 1-2 weeks.  Blood collecting in the tissue (hematoma) that may be painful to the touch. It should usually decrease in size and tenderness within 1-2 weeks. HOME CARE INSTRUCTIONS  Take medicines only as directed by your health care provider.  You may shower 24-48 hours after the procedure or as directed by your health care provider. Remove the bandage (dressing) and gently wash the site with plain soap and water. Pat the area dry with a clean towel. Do not rub the site, because this may cause bleeding.  Do not take baths, swim, or use a hot tub until your health care provider approves.  Check your insertion site every day for redness, swelling, or drainage.  Do not apply powder or lotion to the site.  Do not flex or bend the affected arm for 24 hours or as directed by your health care provider.  Do not push or pull heavy objects with the affected arm for 24 hours or as directed by your health care provider.  Do not lift over 10 lb (4.5 kg) for 5 days after your procedure or as directed by your health care provider.  Ask your health care provider when it is okay to:  Return to work or school.  Resume usual physical activities or sports.  Resume sexual activity.  Do not drive home if you are discharged the same day as the procedure. Have someone else drive you.  You may drive  24 hours after the procedure unless otherwise instructed by your health care provider.  Do not operate machinery or power tools for 24 hours after the procedure.  If your procedure was done as an outpatient procedure, which means that you went home the same day as your procedure, a responsible adult should be with you for the first 24 hours after you arrive home.  Keep all follow-up visits as directed by your health care provider. This is important. SEEK MEDICAL CARE IF:  You have a fever.  You have chills.  You have increased bleeding from the radial site. Hold pressure on the site. SEEK IMMEDIATE MEDICAL CARE IF:  You have unusual pain at the radial site.  You have redness, warmth, or swelling at the radial site.  You have drainage (other than a small amount of blood on the dressing) from the radial site.  The radial site is bleeding, and the bleeding does not stop after 30 minutes of holding steady pressure on the site.  Your arm or hand becomes pale, cool, tingly, or numb.   This information is not intended to replace advice given to you by your health care provider. Make sure you discuss any questions you have with your health care provider.   Document Released: 08/24/2010 Document Revised: 08/12/2014 Document Reviewed: 02/07/2014 Elsevier Interactive Patient Education [employer]

## 2015-10-13 ENCOUNTER — Ambulatory Visit (HOSPITAL_BASED_OUTPATIENT_CLINIC_OR_DEPARTMENT_OTHER): Payer: BLUE CROSS/BLUE SHIELD | Attending: Cardiology

## 2015-10-13 VITALS — Ht 70.0 in | Wt 175.0 lb

## 2015-10-13 DIAGNOSIS — I469 Cardiac arrest, cause unspecified: Secondary | ICD-10-CM

## 2015-10-13 DIAGNOSIS — G4733 Obstructive sleep apnea (adult) (pediatric): Secondary | ICD-10-CM

## 2015-10-14 ENCOUNTER — Telehealth: Payer: Self-pay | Admitting: Internal Medicine

## 2015-10-15 DIAGNOSIS — I469 Cardiac arrest, cause unspecified: Secondary | ICD-10-CM | POA: Insufficient documentation

## 2015-10-15 NOTE — Sleep Study (Signed)
Patient Name: Brett Davis, Brett Davis MRN: [id number] Study Date: 10/13/2015 Gender: Male D.O.B: 1965-10-17 Age (years): 50 Referring Provider: Peter Swaziland, MD Interpreting Physician: Armanda Magic MD, ABSM RPSGT: Melburn Popper  Weight (lbs): 175 BMI: 25 MRN: [id number] Neck Size: 17.00  CLINICAL INFORMATION Sleep Study Type: NPSG Indication for sleep study: OSA  SLEEP STUDY TECHNIQUE As per the AASM Manual for the Scoring of Sleep and Associated Events v2.3 (April 2016) with a hypopnea requiring 4% desaturations. The channels recorded and monitored were frontal, central and occipital EEG, electrooculogram (EOG), submentalis EMG (chin), nasal and oral airflow, thoracic and abdominal wall motion, anterior tibialis EMG, snore microphone, electrocardiogram, and pulse oximetry.  MEDICATIONS Patient's medications include: Albuterol, Xanax, ASA, atorvastatin, Baclofen, Imdur, Bystolic, Percocet, Ambien, Horizant. Medications self-administered by patient during sleep study : No sleep medicine administered.  SLEEP ARCHITECTURE The study was initiated at 10:56:17 PM and ended at 12:30:10 AM when patient went into cardiac arrest. Sleep onset time was 73.1 minutes and the sleep efficiency was 0.5%. The total sleep time was 0.5 minutes. Stage REM latency was N/A minutes. Supine sleep was 0.00%.  RESPIRATORY PARAMETERS The overall apnea/hypopnea index (AHI) was 0.0 per hour. during the initial stages of the sleep study.   There were 0 total apneas, including 0 obstructive, 0 central and 0 mixed apneas. There were 0 hypopneas and 0 RERAs. The AHI during Stage REM sleep was N/A per hour. AHI while supine was N/A per hour. snoring was noted during this study.  CARDIAC DATA  The 2 lead EKG demonstrated sinus rhythm. The mean heart rate was 73.64 beats per minute. Other EKG findings include: Marked ST segment depression with polymorphic ventricular tachycardia leading to ventricular  fibrillation.  LEG MOVEMENT DATA The total PLMS were 0 with a resulting PLMS index of 0.00. Associated arousal with leg movement index was 0.0 .  IMPRESSIONS Tech Report as follows: Male pt age 50 arrived at the [facility] at approximately 7:30pm for a split night study. Pt. requested to get into the room as soon as possible to watch the [employer] vs. [employer]. Pt arrived with his wife with dinner from [employer]. Upon arrival pt did not seem in any distress and went out to his car 2 times prior to application of equipment to look for his charger/smoke. Pt stated that he wanted to leave as soon as the study was over. Per pt wife, the pt administered his normal night meds which consisted of the following: Ambien, Baclofan, Aspirin, Xanax, Imdur, Lipitor, Bystolic, Oxycodone, and Horizant. Melburn Popper, RPSGT and [facility], RPSGT went into the patients room to start applying equipment. Hook-Up was completed. BioCals were performed and study was started. Ater a few minutes pt rolled to right side to reposition and wife came over to pt and tech called in to see if he was OK. Pt stated that he was in pain so tech went into the room to see if he was OK. When tech entered the room pt complained of left arm pain and jaw pain. Tech asked pt if she could call 911 to get him help and pt refused. Ms. Smestad stated at that time that he had 2 bottles of Nitostat and did not bring any with him. Tech then suggested to Ms. Elman that she felt he needed to be seen in the ER and both her and the patient refused for 911 to be called. Tech left the room and continued to monitor pt. At approx.. 12:13 am the  pulse ox quit working and tech went into the room to troubleshoot. Pt could be heard breathing while tech was in the room. Troubleshooting was performed and [employer] brought in another pulse ox at approximately 1215am according to acquisition for pulse ox to be changed out. Problem still not resolved  and troubleshooting continued. Approximately 12:24am pt was found unresponsive, pulse was checked and no pulse was detected. 911 was called and CPR was immediately started. Cherylann Parr and Celene Kras joined to assist with CPR until EMS arrived.  Review of PSG shows that patient developed significant ST segment depression on the EKG rhythm strip during the chest pain. This was followed by a few PVCs and then 2 episodes of polymorphic ventricular tachycardia, the second of which degenerated into ventricular fibrillation. No evidence of sleep disordered breathing was noted on initial tracings at beginning of the night.  DIAGNOSIS - Cardiac arrest   Quintella Reichert Diplomate, American Board of Sleep Medicine  ELECTRONICALLY SIGNED ON:  10/15/2015, 8:34 PM [facility] PH: [phone]   FX: [phone] ACCREDITED BY THE AMERICAN ACADEMY OF SLEEP MEDICINE

## 2015-10-18 ENCOUNTER — Telehealth: Payer: Self-pay | Admitting: Cardiology

## 2015-10-18 NOTE — Telephone Encounter (Signed)
10/18/2015 Received Death Certificate from [facility] on patient for Dr. Royann Shivers to fill out he is not back in office till Monday I told them that are understand I will give to Dr. Salena Saner on Monday.  cbr

## 2015-10-25 ENCOUNTER — Ambulatory Visit: Payer: BLUE CROSS/BLUE SHIELD | Admitting: Cardiology

## 2015-11-04 NOTE — Telephone Encounter (Signed)
Received call from ED physician Dr. Nicanor Alcon regarding the patient that he expired while at the sleep center at [facility]. They wanted to get in touch with Dr. Peter Swaziland about this as the wife said that the sleep study was ordered by Dr. Swaziland. It was told to them that he is not available overnight. They wanted to let him know that he will receive the death certificate of the patient from the funeral home as he expired in the field and he will need to sign it and return to state.

## 2015-11-04 DEATH — deceased

## 2015-11-09 ENCOUNTER — Ambulatory Visit: Payer: Self-pay | Admitting: Cardiology

## 2015-11-20 ENCOUNTER — Ambulatory Visit: Payer: BLUE CROSS/BLUE SHIELD | Admitting: Cardiology

## 2016-09-18 ENCOUNTER — Telehealth: Payer: Self-pay | Admitting: Cardiology

## 2016-09-18 NOTE — Telephone Encounter (Signed)
Brett Davis is calling on behalf of her father(patient) who is deceased. She has some questions about his cause of death and states that Dr. Swaziland determined his cause of death,as it is listed on his death certificate. She would like to speak with Dr. Swaziland. She states that it would "ease her mind" to know what happened. Thanks.

## 2016-09-18 NOTE — Telephone Encounter (Signed)
Returned call to daughter Agustin Cree no answer.LMTC.

## 2016-10-01 ENCOUNTER — Telehealth: Payer: Self-pay

## 2016-10-01 NOTE — Telephone Encounter (Signed)
Returned call to patient's daughter Samson Frederic at # [phone].No answer.Left message to call me back.

## 2017-01-22 IMAGING — MR MR LUMBAR SPINE W/O CM
5 series · 44 of 48 positions shown · non-contrast
Comparison: Lumbar radiographs 08/08/2014. Chest abdomen and pelvis
CT 12/16/2012.

CLINICAL DATA: 48-year-old male with lumbar back pain radiating to
both hips. Lower extremity weakness. Symptoms for 5 years. Previous
steroid injections. Subsequent encounter.

EXAM:
MRI LUMBAR SPINE WITHOUT CONTRAST
TECHNIQUE: Multiplanar, multisequence MR imaging of the lumbar spine was
performed. No intravenous contrast was administered.

[Series 3: T2 · sagittal · 4.0mm · 0.88mm/px · 6 of 13 slices shown (1 of 2)]
[im 1/13]
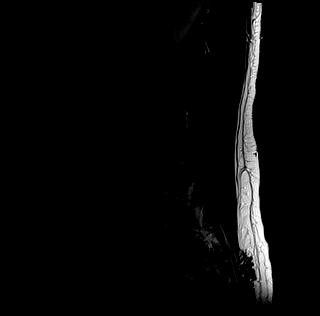
[im 3/13]
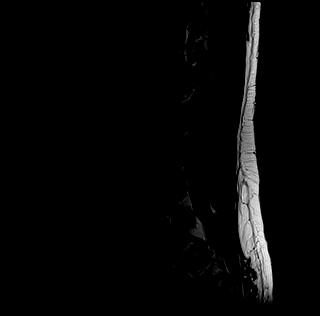
[im 5/13]
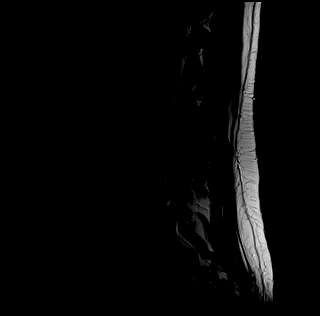
[im 8/13]
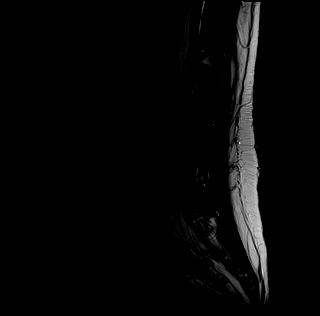
[im 10/13]
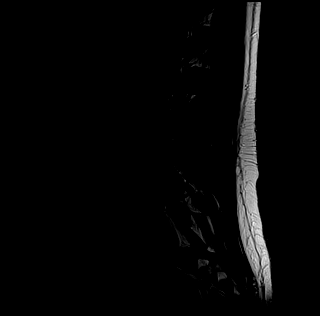
[im 13/13]
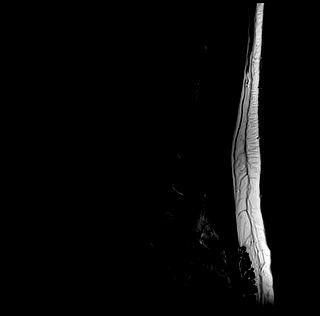

[Series 4: tirm sag · sagittal · 4.0mm · 0.55mm/px · 6 of 13 slices shown]
[im 1/13]
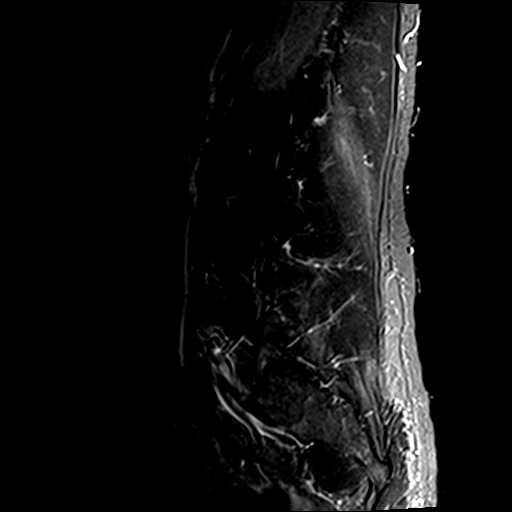
[im 3/13]
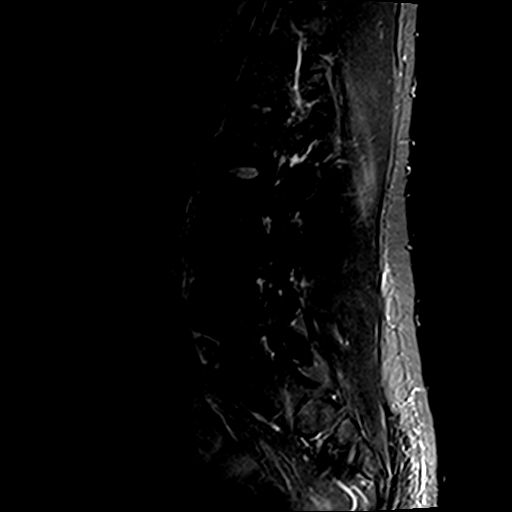
[im 5/13]
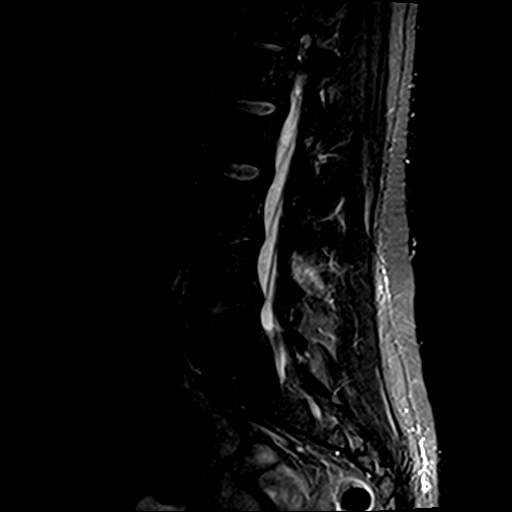
[im 8/13]
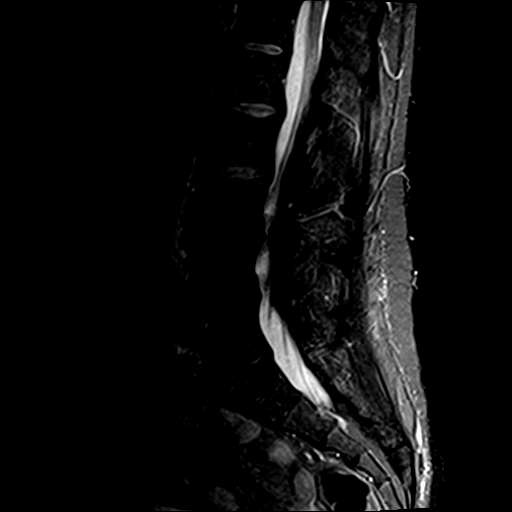
[im 10/13]
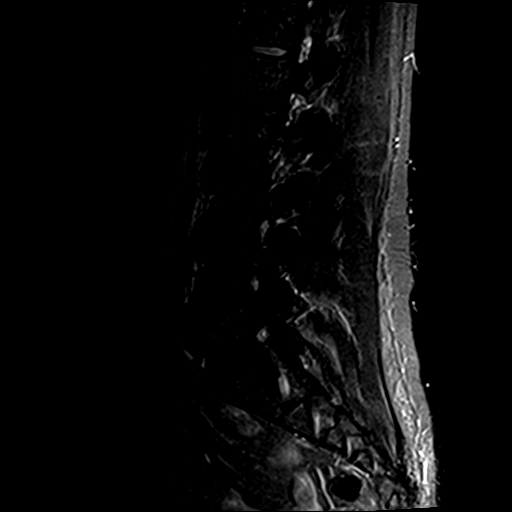
[im 13/13]
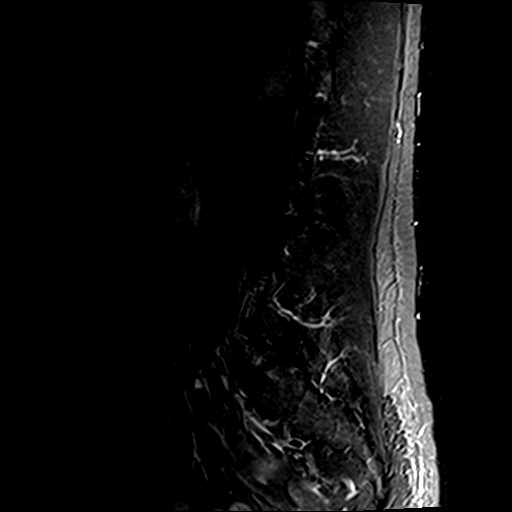

[Series 5: T1 · sagittal · 4.0mm · 0.88mm/px · 6 of 13 slices shown (1 of 2)]
[im 1/13]
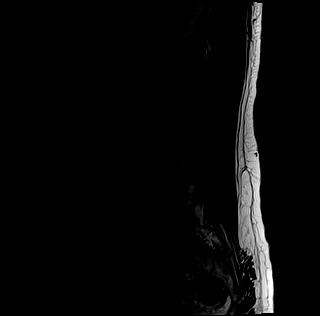
[im 3/13]
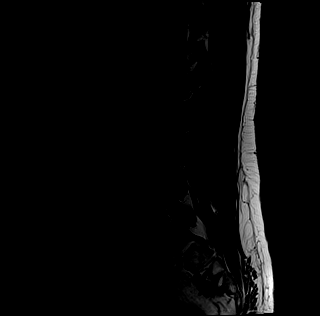
[im 5/13]
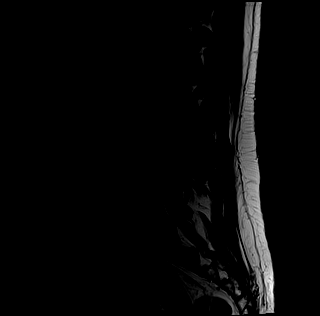
[im 8/13]
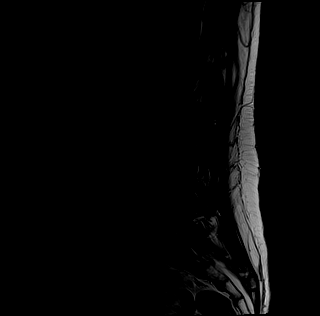
[im 10/13]
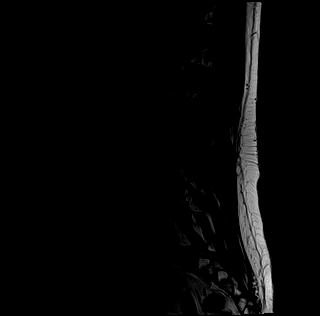
[im 13/13]
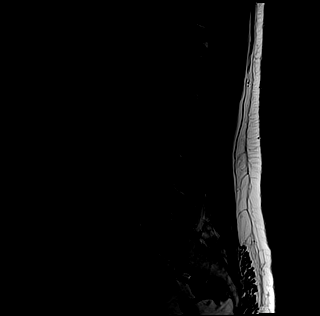

[Series 6: T1 · axial · 4.0mm · 0.70mm/px · z∈[-75,+110]mm · 11 of 33 slices shown (2 of 2)]
[im 1/33]
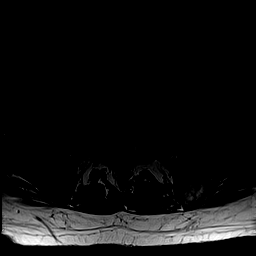
[im 3/33]
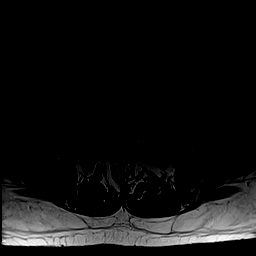
[im 5/33]
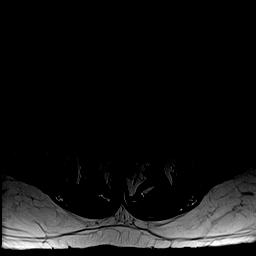
[im 7/33]
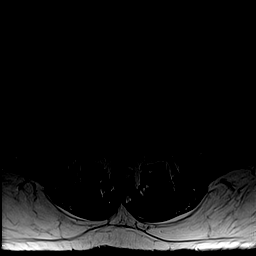
[im 10/33]
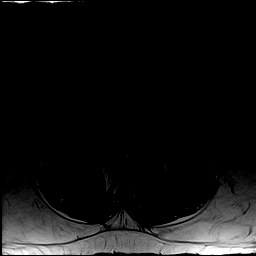
[im 14/33]
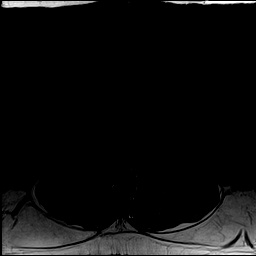
[im 17/33]
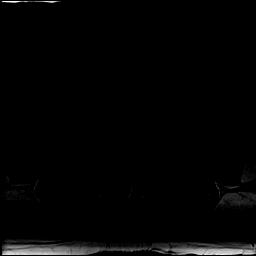
[im 19/33]
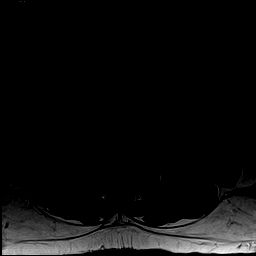
[im 23/33]
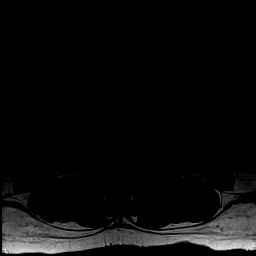
[im 28/33]
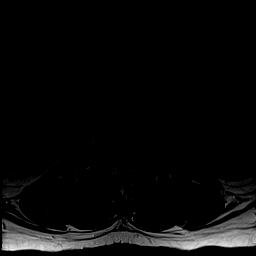
[im 33/33]
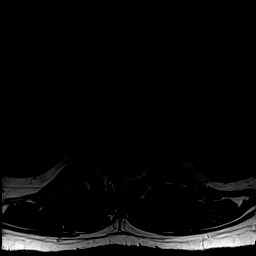

[Series 7: T2 · axial · 4.0mm · 0.70mm/px · z∈[-75,+110]mm · 15 of 33 slices shown (2 of 2)]
[im 1/33]
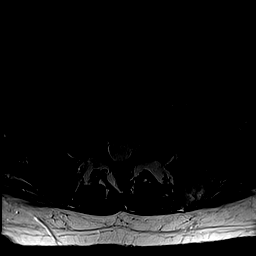
[im 3/33]
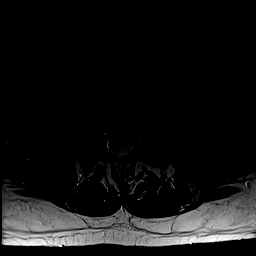
[im 5/33]
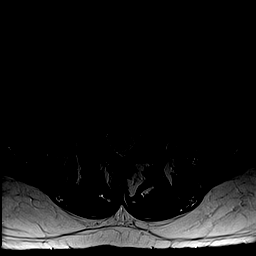
[im 7/33]
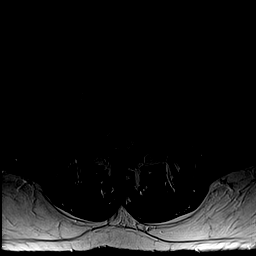
[im 10/33]
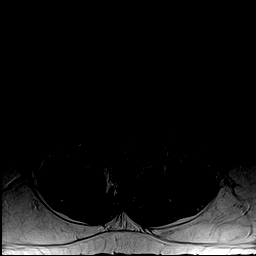
[im 12/33]
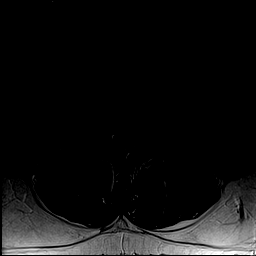
[im 14/33]
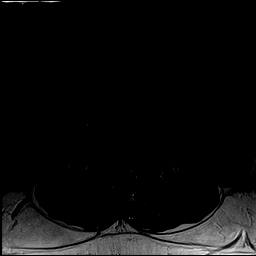
[im 17/33]
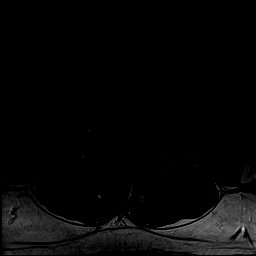
[im 19/33]
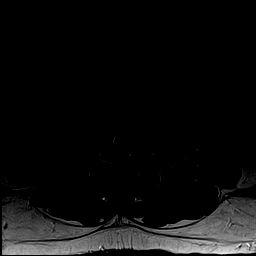
[im 21/33]
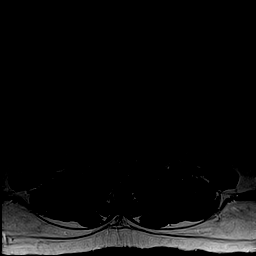
[im 23/33]
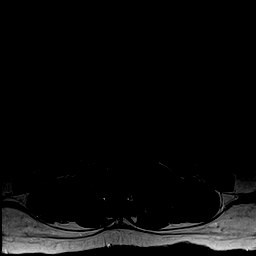
[im 26/33]
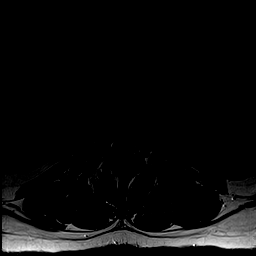
[im 28/33]
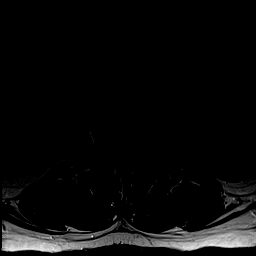
[im 30/33]
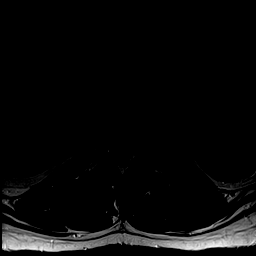
[im 33/33]
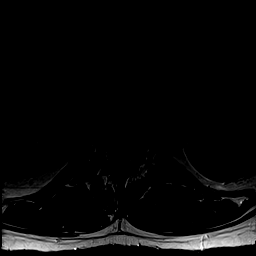

[44 of 48 positions shown; findings below may reference images not displayed]

FINDINGS: Normal lumbar segmentation demonstrated on the recent radiographs.
Mild levoconvex lumbar scoliosis. Stable vertebral height and
alignment. No marrow edema or evidence of acute osseous abnormality.
New graph

Visualized lower thoracic spinal cord is normal with conus medularis
at L1.

Visualized abdominal viscera and paraspinal soft tissues are within
normal limits.

T12-L1:  Negative.

L1-L2:  Mild endplate spurring.  No stenosis.

L2-L3: Minimal disc bulge. Mild facet hypertrophy. Mild endplate
spurring. No stenosis.

L3-L4: Mild disc desiccation and disc space loss. Circumferential
disc bulge. Mild facet hypertrophy. Mild left greater than right L3
foraminal stenosis.

L4-L5: Mild disc desiccation. Circumferential disc bulge. Mild facet
hypertrophy. Trace facet joint fluid on the left. Mild left greater
than right L4 foraminal stenosis.

L5-S1: Minimal disc desiccation and disc bulge. Mild endplate
spurring. Mild facet hypertrophy. No stenosis.
IMPRESSION: Mild lumbar disc degeneration from L3-L4 to L5-S1. Disc bulging
without discrete disc herniation. No lumbar spinal or lateral recess
stenosis. There is mild bilateral L3 and L4 neural foraminal
stenosis.
